# Patient Record
Sex: Male | Born: 1945
Health system: Southern US, Community
[De-identification: ages and names within clinical notes are randomized; demographics above are authoritative.]

## PROBLEM LIST (undated history)

## (undated) DIAGNOSIS — N200 Calculus of kidney: Secondary | ICD-10-CM

## (undated) DIAGNOSIS — I10 Essential (primary) hypertension: Secondary | ICD-10-CM

## (undated) DIAGNOSIS — E119 Type 2 diabetes mellitus without complications: Secondary | ICD-10-CM

---

## 2001-04-29 ENCOUNTER — Emergency Department (HOSPITAL_COMMUNITY): Admission: EM | Admit: 2001-04-29 | Discharge: 2001-04-29 | Payer: Self-pay | Admitting: Emergency Medicine

## 2001-07-28 ENCOUNTER — Ambulatory Visit (HOSPITAL_COMMUNITY): Admission: RE | Admit: 2001-07-28 | Discharge: 2001-07-28 | Payer: Self-pay | Admitting: Cardiology

## 2001-11-05 ENCOUNTER — Encounter: Payer: Self-pay | Admitting: Internal Medicine

## 2001-11-05 ENCOUNTER — Ambulatory Visit (HOSPITAL_COMMUNITY): Admission: RE | Admit: 2001-11-05 | Discharge: 2001-11-05 | Payer: Self-pay | Admitting: Internal Medicine

## 2002-03-26 ENCOUNTER — Encounter: Admission: RE | Admit: 2002-03-26 | Discharge: 2002-03-26 | Payer: Self-pay | Admitting: Urology

## 2002-03-26 ENCOUNTER — Encounter: Payer: Self-pay | Admitting: Urology

## 2002-12-24 ENCOUNTER — Encounter: Payer: Self-pay | Admitting: Urology

## 2002-12-24 ENCOUNTER — Ambulatory Visit (HOSPITAL_BASED_OUTPATIENT_CLINIC_OR_DEPARTMENT_OTHER): Admission: RE | Admit: 2002-12-24 | Discharge: 2002-12-24 | Payer: Self-pay | Admitting: Urology

## 2002-12-25 ENCOUNTER — Emergency Department (HOSPITAL_COMMUNITY): Admission: EM | Admit: 2002-12-25 | Discharge: 2002-12-26 | Payer: Self-pay | Admitting: Emergency Medicine

## 2003-01-28 ENCOUNTER — Ambulatory Visit (HOSPITAL_BASED_OUTPATIENT_CLINIC_OR_DEPARTMENT_OTHER): Admission: RE | Admit: 2003-01-28 | Discharge: 2003-01-28 | Payer: Self-pay | Admitting: Urology

## 2003-01-28 ENCOUNTER — Encounter: Payer: Self-pay | Admitting: Urology

## 2003-05-18 ENCOUNTER — Ambulatory Visit (HOSPITAL_COMMUNITY): Admission: RE | Admit: 2003-05-18 | Discharge: 2003-05-18 | Payer: Self-pay | Admitting: Urology

## 2003-05-18 ENCOUNTER — Ambulatory Visit (HOSPITAL_BASED_OUTPATIENT_CLINIC_OR_DEPARTMENT_OTHER): Admission: RE | Admit: 2003-05-18 | Discharge: 2003-05-18 | Payer: Self-pay | Admitting: Urology

## 2007-04-23 ENCOUNTER — Inpatient Hospital Stay (HOSPITAL_COMMUNITY): Admission: EM | Admit: 2007-04-23 | Discharge: 2007-04-25 | Payer: Self-pay | Admitting: Emergency Medicine

## 2007-04-23 IMAGING — US US ABDOMEN COMPLETE
1 series · 14 of 25 positions shown · non-contrast
Comparison: none

CLINICAL DATA: Chest pain.   Epigastric pain.
 ABDOMEN ULTRASOUND COMPLETE ? [DATE]:
TECHNIQUE: Complete abdominal ultrasound examination was performed including evaluation of the liver, gallbladder, bile ducts, pancreas, kidneys, spleen, IVC, and abdominal aorta.

[Series 1: unknown · 0.33mm/px · 14 of 59 slices shown]
[im 1/59]
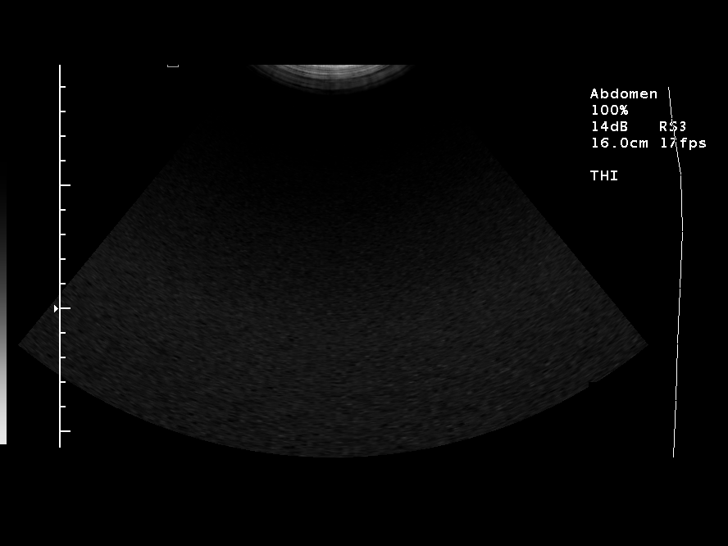
[im 5/59]
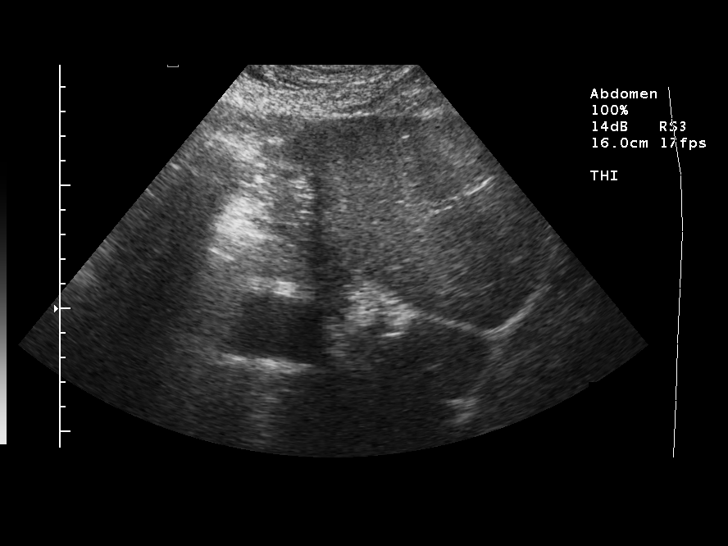
[im 10/59]
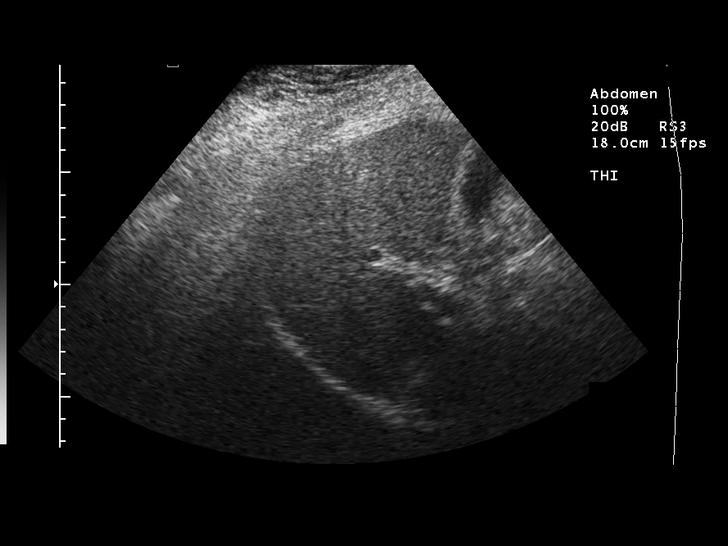
[im 15/59]
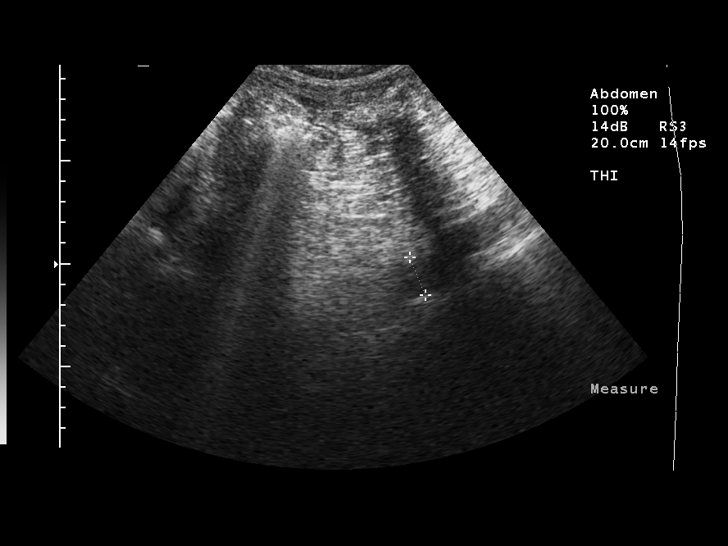
[im 20/59]
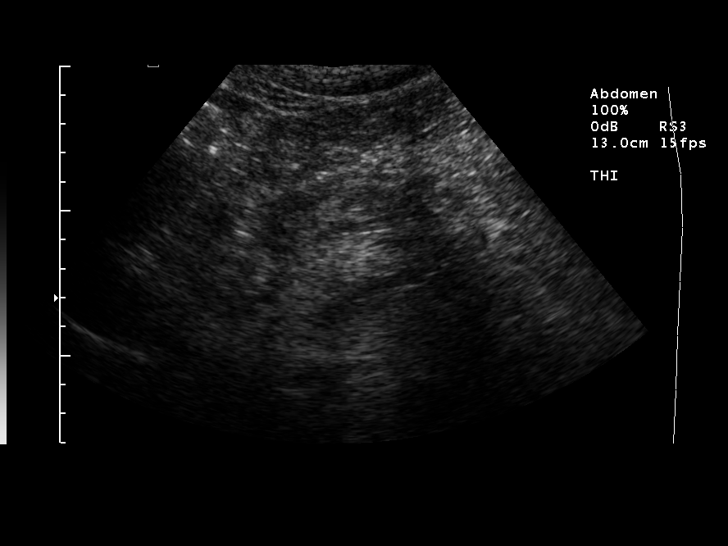
[im 22/59]
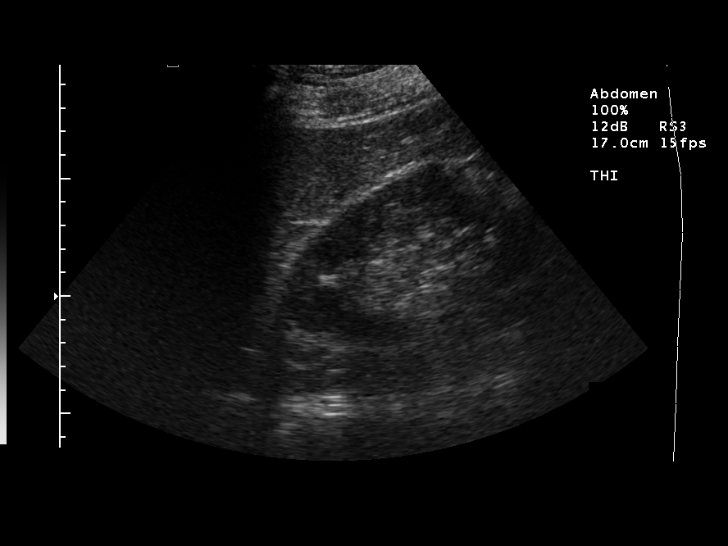
[im 27/59]
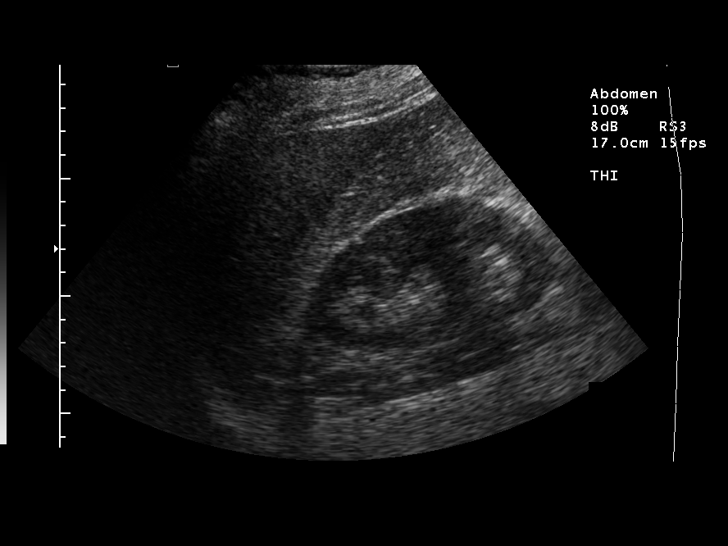
[im 32/59]
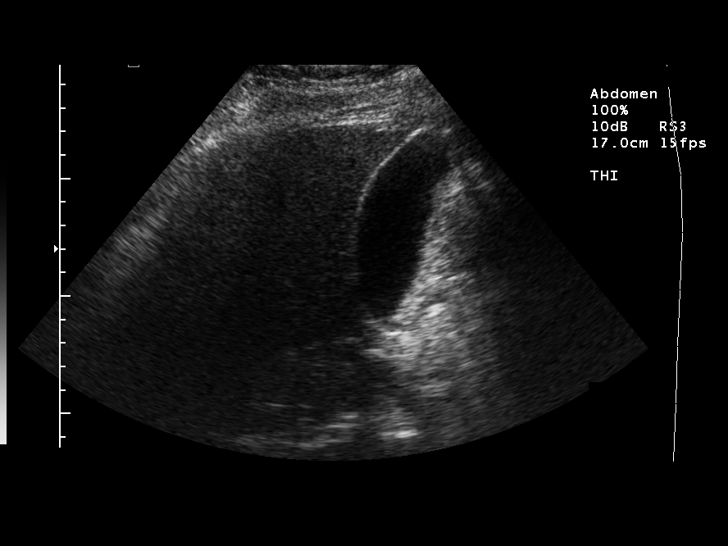
[im 37/59]
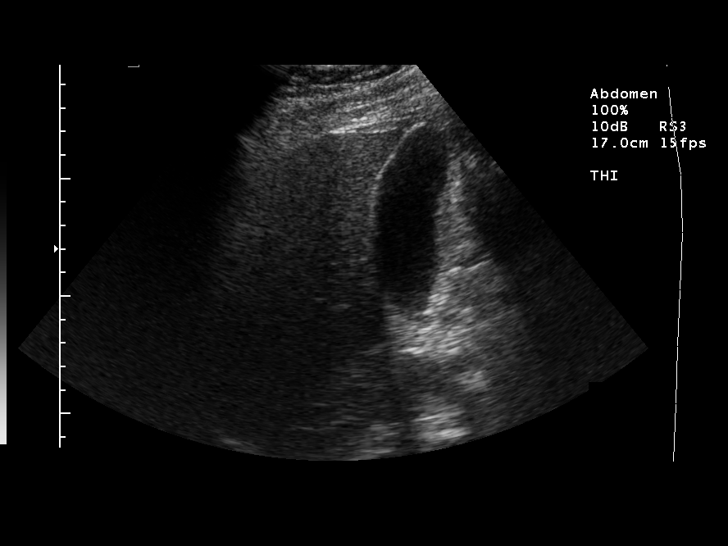
[im 39/59]
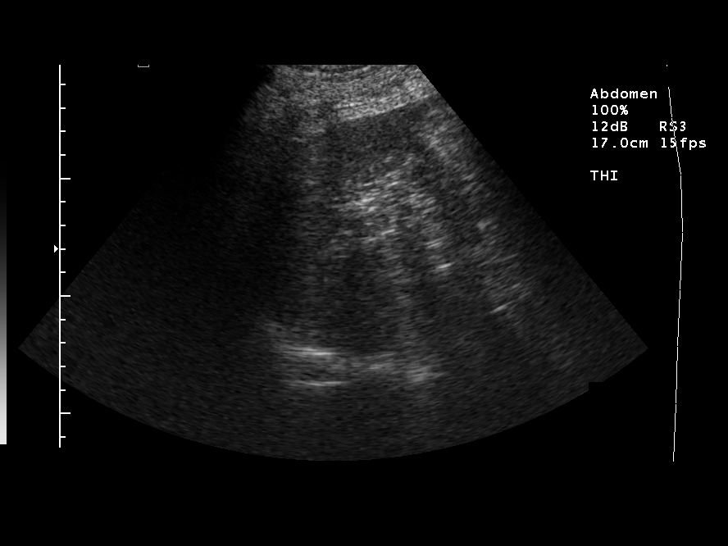
[im 44/59]
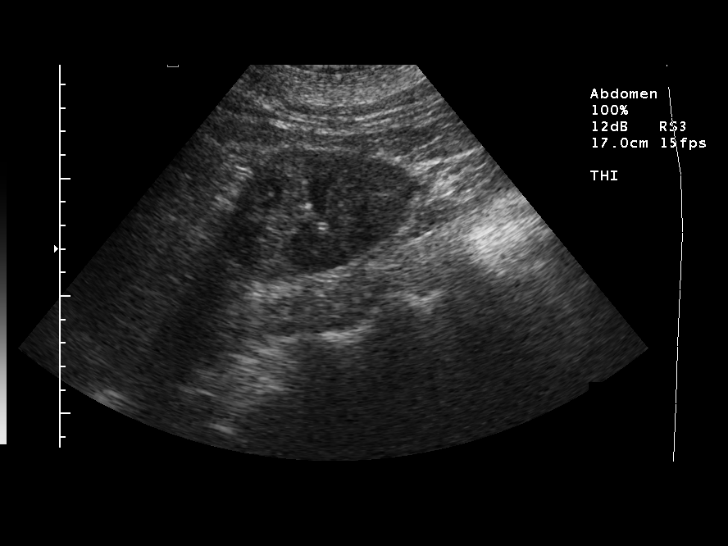
[im 49/59]
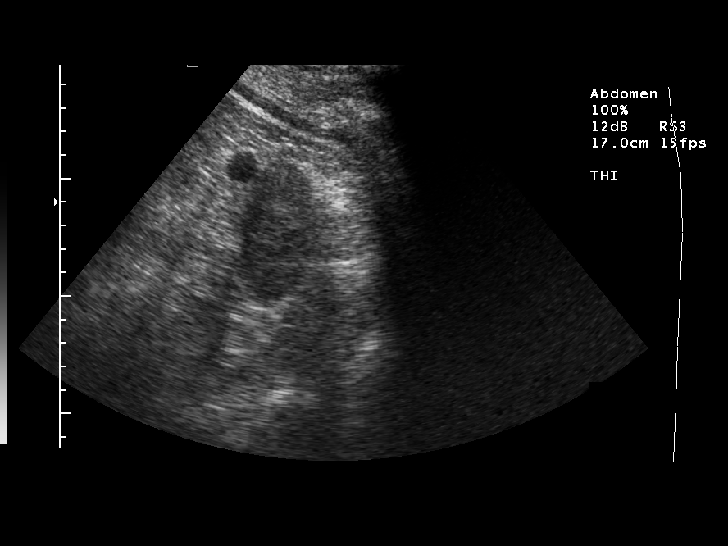
[im 54/59]
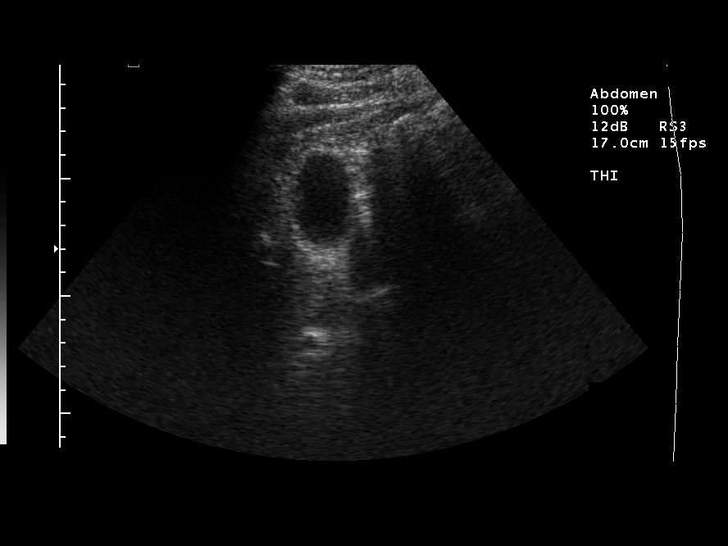
[im 59/59]
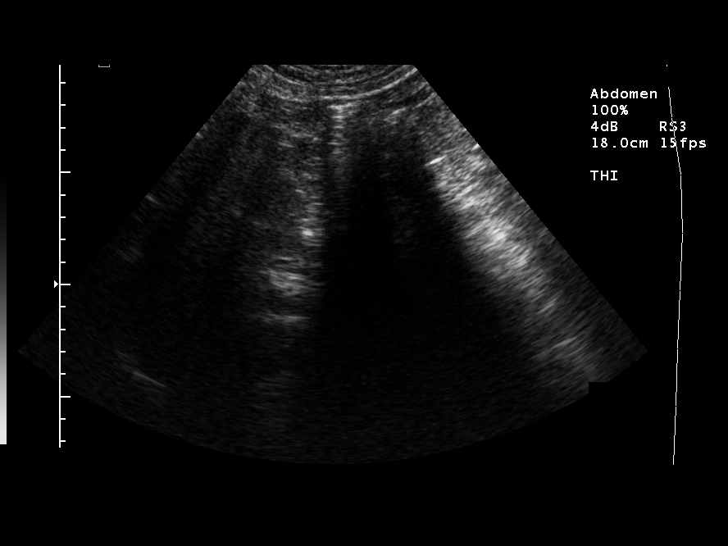

[14 of 25 positions shown; findings below may reference images not displayed]

FINDINGS: The liver, gallbladder, extrahepatic bile duct, and IVC are unremarkable.  The pancreas is not well visualized.  The spleen measures 10.1 cm.  There is a 1.3 x 1.2 x 1.0 cm anechoic lesion with increased through-transmission within the right kidney as well as a 1.5 x 1.5 x 1.3 cm anechoic lesion with increased through-transmission in the left kidney.  The aorta is unremarkable.
IMPRESSION: 1.  No acute findings.
 2.  Bilateral renal cysts.

## 2010-11-28 NOTE — H&P (Signed)
NAME:  Michael Tran, DOLPH NO.:  [id number]   MEDICAL RECORD NO.:  [id number]          PATIENT TYPE:  INP   LOCATION:  1825                         FACILITY:  [facility]   PHYSICIAN:  Mobolaji B. Bakare, M.D.DATE OF BIRTH:  May 07, 1946   DATE OF ADMISSION:  04/23/2007  DATE OF DISCHARGE:                              HISTORY & PHYSICAL   PRIMARY CARE PHYSICIAN:  Dr. Lucas Mallow.   CHIEF COMPLAINT:  Epigastric pain.   HISTORY OF PRESENTING COMPLAINT:  Michael Tran is a pleasant 65 year old  Caucasian male with history of diabetes mellitus type 2 and dyslipidemia  and hypertension. He was in his usual state of health until yesterday  afternoon while at work when he had a transient epigastric pain which  went away. It was less than 1 minute. This pain recurred about 10 p.m.  yesterday while watching TV, and he had to double over. He was  diaphoretic. Pain was radiating to his left arm. There was no  accompanying shortness of breath, palpitations, nausea or vomiting. The  patient lasted 30 minutes, and it went away spontaneously. The patient  went to bed late last night. The patient again woke him up about 2 p.m.  but did not last that long. Was able to go back to bed. Again, he had  the pain at 5:30 but with less intensity, and the patient went to Dr.  Pollie Friar office today.   At Dr. Pollie Friar office, EKG was done which clearly showed ST depression  in leads II, III and aVF which was a clear change from EKG in February  of 2008. He was then sent over to emergency room The patient received 4  baby aspirin in route from the office to the emergency room. He rated  the pain as 8/10 at his worst last night, and here in the emergency  room, pain is rated at 3/10. The patient received 1 sublingual  nitroglycerin, and pain has gone away completely. His blood pressure is  141/76 and pulse of 59. A repeat EKG done on arrival in the emergency  room showed sinus bradycardia with changes noted in  the office  completely resolved. He will be admitted for unstable angina, and Dr.  Lucas Mallow will seeing patient later today.   REVIEW OF SYSTEMS:  He denies fever. No headaches or dizziness. No  syncope. No cough, shortness of breath.   PAST MEDICAL HISTORY:  1. Type 2 diabetes mellitus.  2. Hypertension.  3. Dyslipidemia.  4. Anxiety.   PAST SURGICAL HISTORY:  1. Herniorrhaphy; the patient cannot recall which side.  2. Procedure for kidney stones.   CURRENT MEDICATIONS:  1. Atenolol 50 mg daily.  2. Lopid 600 mg b.i.d.  3. Accupril 20 mg daily.  4. Actos 30 mg daily.  5. Alprazolam 0.5 half to one tablet b.i.d.  6. Aspirin 81 mg daily.  7. Glipizide 5 mg daily.  8. Pravachol 40 mg daily; the patient has quit using Pravachol in the      past one week because he says it is giving him abdominal pain.   ALLERGIES:  He  does not know the type of allergy he has. He cannot  recall.   FAMILY HISTORY:  Mother passed away from stroke. Father died at the age  of 43 of heart attack. He does not have brothers or sisters. The patient  is married and lives with his wife.   SOCIAL HISTORY:  Does not smoke cigarettes. Does not drink alcohol. He  is Psychologist, occupational.   INITIAL VITAL SIGNS:  Temperature 97.0, blood pressure 141/76, pulse of  59, respiratory rate of 22, O2 saturations of 99%. On examination, the  patient is alert and oriented to time, place and person. Normocephalic,  atraumatic.  Pupils are equal, round, and reactive to light. Extraocular movements  intact.  No elevated JVD. No carotid bruits. Mucous membranes moist.  LUNGS:  Clear clinically to auscultation.  CARDIOVASCULAR:  S1/S2 regular. No murmur. No gallop. No rub.  ABDOMEN:  Nondistended. Soft. Nontender. Bowel sounds are present. There  is no holosystolic murmur. No palpable organomegaly.  EXTREMITIES:  No pedal edema or calf tenderness. Dorsalis pedis pulses  palpably bilaterally.  CENTRAL NERVOUS SYSTEM:  No focal  neurological deficit.   INITIAL LABORATORY DATA:  CK-MB 1.1, troponin 1, myoglobin 72.5,  creatinine 0.7. White cells 4.4, hemoglobin 13.7, hematocrit 40.3,  platelets 192, normal differential. Urinalysis and CMET are pending at  this time.   ASSESSMENT AND PLAN:  Michael Tran is a 65 year old Caucasian male with  multiple cardiovascular risk factors presenting with epigastric pain,  associated with EKG changes which has resolved on arrival in the  emergency room who is currently chest pain free after one sublingual  nitroglycerin. Initial cardiac markers at the point of care is normal.  The patient will be admitted with unstable angina and needs to exclude  other differential gallstone disease and abdominal aortic aneurysm.   ADMISSION DIAGNOSES:  1. Unstable angina/acute coronary syndrome. Admit to telemetry. Cycle      cardiac panel q.8h. x3 sets. Sublingual nitroglycerin p.r.n.      Lovenox 1 mg/kg body weight subcutaneously q.12h. Aspirin 325 mg      daily. Will continue atenolol at a reduced dose of 25 mg daily in      view of sinus bradycardia. Continue Accupril 30 mg daily. Will      obtain ultrasound of the abdomen to rule out gallstones and AAA.      Will give Protonix 40 mg p.o. daily. Dr. Lucas Mallow will be seeing      patient later today.  2. Diabetes mellitus. Will resume Glipizide, Actos and cover with      sliding scale insulin. Check hemoglobin A1c to ensure control prior      to hospitalization.  3. Dyslipidemia. Resume Lopid and Pravachol. Check fasting lipid      profile.      Mobolaji B. Corky Downs, M.D.  Electronically Signed    MBB/MEDQ  D:  04/23/2007  T:  04/23/2007  Job:  [id number]

## 2010-12-01 NOTE — Discharge Summary (Signed)
NAME:  Michael Tran, Michael Tran NO.:  [id number]   MEDICAL RECORD NO.:  [id number]          PATIENT TYPE:  INP   LOCATION:  2003                         FACILITY:  [facility]   PHYSICIAN:  Mobolaji B. Bakare, M.D.DATE OF BIRTH:  1945-12-01   DATE OF ADMISSION:  04/23/2007  DATE OF DISCHARGE:  04/25/2007                               DISCHARGE SUMMARY   PRIMARY CARE PHYSICIAN:  Jaclyn Prime. Lucas Mallow, M.D.   FINAL DIAGNOSES:  1. Atypical chest pain, noncardiac.  2. Epigastric pain.  3. Diabetes mellitus, controlled.  4. Dyslipidemia.   CONSULT:  Cardiology consult provided by Dr. Antionette Char.   PROCEDURES:  1. Stress test showed no evidence of inducible ischemia, normal LV      function with ejection fraction of 70%.  2. Ultrasound of the abdomen showed no acute findings bilateral renal      cyst.  3. Chest x-ray showed no acute cardiopulmonary process.   BRIEF HISTORY:  Please refer to the admission H&P for full details.  In  brief, Michael Tran was transferred to the hospital from Dr. Pollie Friar  office after an abnormal EKG in the office.  He apparently has been  having epigastric pain the day before admission this was transient and  intermittent.  It was not associated with nausea and vomiting,  palpitation or shortness of breath.  He went to see Dr. Lucas Mallow on the day  of admission and he noted ST depression in II, III and aVF.  The pain  was not in the chest but in the epigastrium.  On arrival in the  emergency room, the repeat EKG did not show ST depression as previously  noted.  The patient was admitted for further evaluation and treatment.   HOSPITAL COURSE:  PROBLEM #1 -  EPIGASTRIC PAIN:  It was felt prudent to  rule him out for inferior myocardial infarction given the EKG changes.  His cardiac enzymes were completely normal.  He underwent a stress test  which showed no inducible ischemia.  The patient did not have recurrence  of epigastric pain while in the hospital.   He was started on Protonix  intravenously and this was transitioned to p.o.  He had an ultrasound of  the abdomen to rule out gallstones.  Ultrasound was essentially normal.  It was felt that the patient would benefit from upper endoscopy to rule  out gastritis.  It should be mentioned that his amylase and lipase were  within normal.  Appointment was set up to follow up with Dr. Virginia Rochester on  May 12, 2007, at 2:00 p.m.   PROBLEM #2 -  DIABETES MELLITUS:  This was controlled during the course  of hospitalization, hemoglobin A1c was normal 6.6.  The patient was  continued on glipizide.   PROBLEM #3 -  DYSLIPIDEMIA:  Fasting blood lipids revealed total  cholesterol 224, HDL 45, LDL of 163.  The patient has been on Lopid.  He  will continue with this.  Further optimization per Dr. Lucas Mallow.   DISCHARGE MEDICATIONS:  1. Atenolol 50 mg daily.  2. Lopid 600 mg twice  a day.  3. Accupril 20 mg daily.  4. Actos 30 mg daily.  5. Glipizide 5 mg daily.  6. Alprazolam 0.5 mg half to one tablet b.i.d.  7. Aspirin 81 mg daily.  8. Flomax 0.4 mg daily.  9. Protonix 40 mg daily.   FOLLOW UP:  1. Follow up with Dr. Lucas Mallow in one to two weeks.  2. Follow up with Dr. Virginia Rochester on Monday May 12, 2007, at 2:00 p.m.   DISCHARGE LABORATORY DATA:  White cells 8.3, hemoglobin 13.1, hematocrit  38.7, platelets 198.  Hemoglobin A1c 6.6.      Mobolaji B. Corky Downs, M.D.  Electronically Signed     MBB/MEDQ  D:  05/09/2007  T:  05/11/2007  Job:  [id number]   cc:   Jaclyn Prime. Lucas Mallow, M.D.  Georgiana Spinner, M.D.

## 2010-12-01 NOTE — Op Note (Signed)
NAME:  Michael Tran, Michael Tran                          ACCOUNT NO.:  [id number]   MEDICAL RECORD NO.:  [id number]                   PATIENT TYPE:  AMB   LOCATION:  [facility]                                 FACILITY:  [facility]   PHYSICIAN:  Ronald L. Ovidio Hanger, M.D.           DATE OF BIRTH:  April 11, 1946   DATE OF PROCEDURE:  05/18/2003  DATE OF DISCHARGE:                                 OPERATIVE REPORT   DIAGNOSIS:  Multiple extensive mid ureterolithiasis.   OPERATIVE PROCEDURE:  1. Cystourethroscopy.  2. Right ureteroscopy with balloon dilatation of right lower ureter.  3. Holmium laser lithotripsy of multiple large stones.  4. Serial stone basket extraction of a large volume of stones.  5. Placement of right double-J stent.   SURGEON:  Lucrezia Starch. Earlene Plater, M.D.   ANESTHESIA:  LMA.   ESTIMATED BLOOD LOSS:  20 mL.   TUBES:  26 cm 7 French Cook double-pigtail stent.   COMPLICATIONS:  None.   INDICATIONS FOR PROCEDURE:  Michael Tran is a very nice, 65 year old, white  male, who originally presented with large stones.  He is status post ESWL on  January 28, 2003.  He has multiple large fragments that we have been following  and subsequently he has passed some fragments, but a very large fragment is  lodged in the upper ureter, and he has several fragments in the lower pole  kidney.  We felt a repeat ESWL was indicated and in June, he underwent  repeat ESWL.  He has again passed some fragments but has two large  fragments, one in the mid ureter and one above it that just passed out of  the kidney.  He has periodic pain and after understanding risks. benefits,  and alternatives elected to proceed with ureteroscopic stone extraction.   PROCEDURE IN DETAIL:  The patient was placed in a supine position.  After  proper LMA anesthesia, was placed in the dorsal lithotomy position and  prepped and draped with Betadine in a sterile fashion.  Cystourethroscopy  was performed with the 22.5 French Olympus  panendoscope, utilizing the 12  and 70 degree lenses.  The bladder was carefully inspected.  There was mild  trilobar hypertrophy; grade 1 trabeculation was noted, and efflux of clear  urine was noted from left ureteral orifice.  The right ureteral orifice was  in normal location.  Under fluoroscopic guidance, a 0.038 French Teflon-  coated guidewire was placed and noted to be within the right renal pelvis.  The stones could be seen on fluoroscopy.  The lower third ureter was then  balloon dilated with a 10 cm, 5 mm balloon dilator to 10 atmospheres of  pressure for two minutes.  It appeared to open widely without any waisting.  It was subsequently removed, and ureteroscopy was performed with a short,  thin ACMI ureteroscope.  Two large fragments were noted to be in the mid  ureter and above  it was a very large stone.  Utilizing the 365 laser fiber,  each of the stones were serially fractured at a repetition rate of 5 and a  setting of 0.5.  It was fragmented into multiple small fragments.  It was  extensive, required some time and multiple fragmentation.  Eventually, it  was fragmented into small enough fragments to begin extraction.  Serial  ureteroscopies then were performed to remove the fragments utilizing the  nitinol basket.  A total of approximately 14 ureteroscopies were performed  to remove the large volume stone debris.  Following this, stones were  submitted for stone analysis.  Reinspection of the ureter to the  ureteropelvic junction revealed only very small fragments and felt that they  would pass easily, and there were no perforations or significant bleeding  noted.  The ureteroscope was visually removed and under fluoroscopic  guidance utilizing the cystoscope, a 26 cm 7 French Cook double-pigtail  stent was placed in order to get in good position within the right renal  pelvis and within the bladder fluoroscopically.  The bladder was drained.  The panendoscope was removed.   A pull-out string was taped to the penis, and  the patient was taken to the recovery room stable.                                               Ronald L. Ovidio Hanger, M.D.    RLD/MEDQ  D:  05/18/2003  T:  05/18/2003  Job:  [id number]

## 2011-04-26 LAB — DIFFERENTIAL
Basophils Absolute: 0
Basophils Relative: 1
Eosinophils Absolute: 0
Eosinophils Relative: 1
Lymphocytes Relative: 22
Lymphs Abs: 1
Monocytes Absolute: 0.3
Monocytes Relative: 7
Neutro Abs: 3
Neutrophils Relative %: 69

## 2011-04-26 LAB — CK TOTAL AND CKMB (NOT AT ARMC)
CK, MB: 2
Relative Index: 1.5
Total CK: 136

## 2011-04-26 LAB — CBC
HCT: 40.3
Hemoglobin: 13.7
MCHC: 33.8
MCHC: 34
MCV: 92.8
Platelets: 191
Platelets: 198
RBC: 4.17 — ABNORMAL LOW
RBC: 4.34
RDW: 12.5
RDW: 12.6
WBC: 4.4

## 2011-04-26 LAB — URINE MICROSCOPIC-ADD ON

## 2011-04-26 LAB — COMPREHENSIVE METABOLIC PANEL
ALT: 24
AST: 25
Albumin: 4.3
Alkaline Phosphatase: 43
BUN: 17
CO2: 22
Calcium: 9.4
Chloride: 103
Creatinine, Ser: 0.82
GFR calc Af Amer: >60
GFR calc non Af Amer: >60
Glucose, Bld: 117 — ABNORMAL HIGH
Potassium: 4.1
Sodium: 135
Total Bilirubin: 0.6
Total Protein: 7.1

## 2011-04-26 LAB — URINALYSIS, ROUTINE W REFLEX MICROSCOPIC
Bilirubin Urine: NEGATIVE
Glucose, UA: NEGATIVE
Hgb urine dipstick: NEGATIVE
Ketones, ur: NEGATIVE
Nitrite: NEGATIVE
Protein, ur: NEGATIVE
Specific Gravity, Urine: 1.016
Urobilinogen, UA: 0.2
pH: 5.5

## 2011-04-26 LAB — POCT I-STAT CREATININE
Creatinine, Ser: 0.7
Operator id: 294521

## 2011-04-26 LAB — LIPID PANEL
Cholesterol: 224 — ABNORMAL HIGH
HDL: 45
LDL Cholesterol: 163 — ABNORMAL HIGH
Total CHOL/HDL Ratio: 5
Triglycerides: 80
VLDL: 16

## 2011-04-26 LAB — I-STAT 8, (EC8 V) (CONVERTED LAB)
BUN: 18
Bicarbonate: 22.5
Chloride: 105
Glucose, Bld: 111 — ABNORMAL HIGH
HCT: 43
Hemoglobin: 14.6
Operator id: 294521
Potassium: 4.1
Sodium: 134 — ABNORMAL LOW
TCO2: 23
pCO2, Ven: 29.6 — ABNORMAL LOW
pH, Ven: 7.49 — ABNORMAL HIGH

## 2011-04-26 LAB — HEMOGLOBIN A1C
Hgb A1c MFr Bld: 6.6 — ABNORMAL HIGH
Mean Plasma Glucose: 158

## 2011-04-26 LAB — PROTIME-INR
INR: 1
Prothrombin Time: 13.6

## 2011-04-26 LAB — POCT CARDIAC MARKERS
CKMB, poc: 1.1
Myoglobin, poc: 72.5
Operator id: 294521
Troponin i, poc: <0.05

## 2011-04-26 LAB — APTT: aPTT: 37

## 2011-04-26 LAB — TROPONIN I
Troponin I: 0.01
Troponin I: 0.02

## 2011-04-26 LAB — LIPASE, BLOOD: Lipase: 23

## 2011-05-02 ENCOUNTER — Encounter (INDEPENDENT_AMBULATORY_CARE_PROVIDER_SITE_OTHER): Payer: 59 | Admitting: Ophthalmology

## 2011-05-02 DIAGNOSIS — E11319 Type 2 diabetes mellitus with unspecified diabetic retinopathy without macular edema: Secondary | ICD-10-CM

## 2011-05-02 DIAGNOSIS — H2510 Age-related nuclear cataract, unspecified eye: Secondary | ICD-10-CM

## 2011-05-02 DIAGNOSIS — H43819 Vitreous degeneration, unspecified eye: Secondary | ICD-10-CM

## 2012-05-01 ENCOUNTER — Encounter (INDEPENDENT_AMBULATORY_CARE_PROVIDER_SITE_OTHER): Payer: Managed Care, Other (non HMO) | Admitting: Ophthalmology

## 2012-05-01 DIAGNOSIS — I10 Essential (primary) hypertension: Secondary | ICD-10-CM

## 2012-05-01 DIAGNOSIS — H35039 Hypertensive retinopathy, unspecified eye: Secondary | ICD-10-CM

## 2012-05-01 DIAGNOSIS — H43819 Vitreous degeneration, unspecified eye: Secondary | ICD-10-CM

## 2012-05-01 DIAGNOSIS — E11319 Type 2 diabetes mellitus with unspecified diabetic retinopathy without macular edema: Secondary | ICD-10-CM

## 2012-05-01 DIAGNOSIS — E1165 Type 2 diabetes mellitus with hyperglycemia: Secondary | ICD-10-CM

## 2012-05-01 DIAGNOSIS — H2510 Age-related nuclear cataract, unspecified eye: Secondary | ICD-10-CM

## 2013-05-04 ENCOUNTER — Ambulatory Visit (INDEPENDENT_AMBULATORY_CARE_PROVIDER_SITE_OTHER): Payer: Managed Care, Other (non HMO) | Admitting: Ophthalmology

## 2013-05-06 ENCOUNTER — Ambulatory Visit (INDEPENDENT_AMBULATORY_CARE_PROVIDER_SITE_OTHER): Payer: Self-pay | Admitting: Ophthalmology

## 2014-07-21 DIAGNOSIS — E118 Type 2 diabetes mellitus with unspecified complications: Secondary | ICD-10-CM | POA: Diagnosis not present

## 2014-07-21 DIAGNOSIS — I10 Essential (primary) hypertension: Secondary | ICD-10-CM | POA: Diagnosis not present

## 2014-07-21 DIAGNOSIS — M109 Gout, unspecified: Secondary | ICD-10-CM | POA: Diagnosis not present

## 2014-11-01 DIAGNOSIS — E1121 Type 2 diabetes mellitus with diabetic nephropathy: Secondary | ICD-10-CM | POA: Diagnosis not present

## 2014-11-01 DIAGNOSIS — M109 Gout, unspecified: Secondary | ICD-10-CM | POA: Diagnosis not present

## 2014-11-01 DIAGNOSIS — E559 Vitamin D deficiency, unspecified: Secondary | ICD-10-CM | POA: Diagnosis not present

## 2014-11-01 DIAGNOSIS — I10 Essential (primary) hypertension: Secondary | ICD-10-CM | POA: Diagnosis not present

## 2014-11-08 DIAGNOSIS — M109 Gout, unspecified: Secondary | ICD-10-CM | POA: Diagnosis not present

## 2014-11-08 DIAGNOSIS — E785 Hyperlipidemia, unspecified: Secondary | ICD-10-CM | POA: Diagnosis not present

## 2014-11-08 DIAGNOSIS — I10 Essential (primary) hypertension: Secondary | ICD-10-CM | POA: Diagnosis not present

## 2014-11-08 DIAGNOSIS — N182 Chronic kidney disease, stage 2 (mild): Secondary | ICD-10-CM | POA: Diagnosis not present

## 2014-11-08 DIAGNOSIS — E559 Vitamin D deficiency, unspecified: Secondary | ICD-10-CM | POA: Diagnosis not present

## 2014-11-08 DIAGNOSIS — E1121 Type 2 diabetes mellitus with diabetic nephropathy: Secondary | ICD-10-CM | POA: Diagnosis not present

## 2015-02-14 DIAGNOSIS — E1122 Type 2 diabetes mellitus with diabetic chronic kidney disease: Secondary | ICD-10-CM | POA: Diagnosis not present

## 2015-02-14 DIAGNOSIS — E1121 Type 2 diabetes mellitus with diabetic nephropathy: Secondary | ICD-10-CM | POA: Diagnosis not present

## 2015-02-14 DIAGNOSIS — E785 Hyperlipidemia, unspecified: Secondary | ICD-10-CM | POA: Diagnosis not present

## 2015-02-14 DIAGNOSIS — I10 Essential (primary) hypertension: Secondary | ICD-10-CM | POA: Diagnosis not present

## 2015-02-14 DIAGNOSIS — Z13820 Encounter for screening for osteoporosis: Secondary | ICD-10-CM | POA: Diagnosis not present

## 2015-02-14 DIAGNOSIS — Z1389 Encounter for screening for other disorder: Secondary | ICD-10-CM | POA: Diagnosis not present

## 2015-02-14 DIAGNOSIS — E559 Vitamin D deficiency, unspecified: Secondary | ICD-10-CM | POA: Diagnosis not present

## 2015-02-14 DIAGNOSIS — Z125 Encounter for screening for malignant neoplasm of prostate: Secondary | ICD-10-CM | POA: Diagnosis not present

## 2015-02-14 DIAGNOSIS — Z23 Encounter for immunization: Secondary | ICD-10-CM | POA: Diagnosis not present

## 2015-02-14 DIAGNOSIS — M109 Gout, unspecified: Secondary | ICD-10-CM | POA: Diagnosis not present

## 2015-02-14 DIAGNOSIS — E663 Overweight: Secondary | ICD-10-CM | POA: Diagnosis not present

## 2015-02-14 DIAGNOSIS — Z0000 Encounter for general adult medical examination without abnormal findings: Secondary | ICD-10-CM | POA: Diagnosis not present

## 2015-02-21 DIAGNOSIS — I129 Hypertensive chronic kidney disease with stage 1 through stage 4 chronic kidney disease, or unspecified chronic kidney disease: Secondary | ICD-10-CM | POA: Diagnosis not present

## 2015-02-21 DIAGNOSIS — N182 Chronic kidney disease, stage 2 (mild): Secondary | ICD-10-CM | POA: Diagnosis not present

## 2015-02-21 DIAGNOSIS — M109 Gout, unspecified: Secondary | ICD-10-CM | POA: Diagnosis not present

## 2015-02-21 DIAGNOSIS — E1122 Type 2 diabetes mellitus with diabetic chronic kidney disease: Secondary | ICD-10-CM | POA: Diagnosis not present

## 2015-02-21 DIAGNOSIS — M8580 Other specified disorders of bone density and structure, unspecified site: Secondary | ICD-10-CM | POA: Diagnosis not present

## 2015-02-21 DIAGNOSIS — E785 Hyperlipidemia, unspecified: Secondary | ICD-10-CM | POA: Diagnosis not present

## 2015-04-29 DIAGNOSIS — Z23 Encounter for immunization: Secondary | ICD-10-CM | POA: Diagnosis not present

## 2015-05-19 DIAGNOSIS — H2513 Age-related nuclear cataract, bilateral: Secondary | ICD-10-CM | POA: Diagnosis not present

## 2015-05-19 DIAGNOSIS — E119 Type 2 diabetes mellitus without complications: Secondary | ICD-10-CM | POA: Diagnosis not present

## 2015-08-17 DIAGNOSIS — I129 Hypertensive chronic kidney disease with stage 1 through stage 4 chronic kidney disease, or unspecified chronic kidney disease: Secondary | ICD-10-CM | POA: Diagnosis not present

## 2015-08-17 DIAGNOSIS — M109 Gout, unspecified: Secondary | ICD-10-CM | POA: Diagnosis not present

## 2015-08-17 DIAGNOSIS — M8580 Other specified disorders of bone density and structure, unspecified site: Secondary | ICD-10-CM | POA: Diagnosis not present

## 2015-08-17 DIAGNOSIS — E1122 Type 2 diabetes mellitus with diabetic chronic kidney disease: Secondary | ICD-10-CM | POA: Diagnosis not present

## 2015-08-24 DIAGNOSIS — E1122 Type 2 diabetes mellitus with diabetic chronic kidney disease: Secondary | ICD-10-CM | POA: Diagnosis not present

## 2015-08-24 DIAGNOSIS — I129 Hypertensive chronic kidney disease with stage 1 through stage 4 chronic kidney disease, or unspecified chronic kidney disease: Secondary | ICD-10-CM | POA: Diagnosis not present

## 2015-08-24 DIAGNOSIS — E785 Hyperlipidemia, unspecified: Secondary | ICD-10-CM | POA: Diagnosis not present

## 2015-08-24 DIAGNOSIS — N182 Chronic kidney disease, stage 2 (mild): Secondary | ICD-10-CM | POA: Diagnosis not present

## 2015-11-21 DIAGNOSIS — E1122 Type 2 diabetes mellitus with diabetic chronic kidney disease: Secondary | ICD-10-CM | POA: Diagnosis not present

## 2015-11-21 DIAGNOSIS — E559 Vitamin D deficiency, unspecified: Secondary | ICD-10-CM | POA: Diagnosis not present

## 2015-11-21 DIAGNOSIS — I129 Hypertensive chronic kidney disease with stage 1 through stage 4 chronic kidney disease, or unspecified chronic kidney disease: Secondary | ICD-10-CM | POA: Diagnosis not present

## 2015-11-21 DIAGNOSIS — M109 Gout, unspecified: Secondary | ICD-10-CM | POA: Diagnosis not present

## 2015-11-21 DIAGNOSIS — M8580 Other specified disorders of bone density and structure, unspecified site: Secondary | ICD-10-CM | POA: Diagnosis not present

## 2015-11-28 DIAGNOSIS — I129 Hypertensive chronic kidney disease with stage 1 through stage 4 chronic kidney disease, or unspecified chronic kidney disease: Secondary | ICD-10-CM | POA: Diagnosis not present

## 2015-11-28 DIAGNOSIS — E1122 Type 2 diabetes mellitus with diabetic chronic kidney disease: Secondary | ICD-10-CM | POA: Diagnosis not present

## 2015-11-28 DIAGNOSIS — E785 Hyperlipidemia, unspecified: Secondary | ICD-10-CM | POA: Diagnosis not present

## 2015-11-28 DIAGNOSIS — N182 Chronic kidney disease, stage 2 (mild): Secondary | ICD-10-CM | POA: Diagnosis not present

## 2016-05-11 DIAGNOSIS — Z23 Encounter for immunization: Secondary | ICD-10-CM | POA: Diagnosis not present

## 2016-05-21 DIAGNOSIS — E119 Type 2 diabetes mellitus without complications: Secondary | ICD-10-CM | POA: Diagnosis not present

## 2016-05-21 DIAGNOSIS — H2513 Age-related nuclear cataract, bilateral: Secondary | ICD-10-CM | POA: Diagnosis not present

## 2016-05-23 DIAGNOSIS — E785 Hyperlipidemia, unspecified: Secondary | ICD-10-CM | POA: Diagnosis not present

## 2016-05-23 DIAGNOSIS — M8580 Other specified disorders of bone density and structure, unspecified site: Secondary | ICD-10-CM | POA: Diagnosis not present

## 2016-05-23 DIAGNOSIS — Z125 Encounter for screening for malignant neoplasm of prostate: Secondary | ICD-10-CM | POA: Diagnosis not present

## 2016-05-23 DIAGNOSIS — I129 Hypertensive chronic kidney disease with stage 1 through stage 4 chronic kidney disease, or unspecified chronic kidney disease: Secondary | ICD-10-CM | POA: Diagnosis not present

## 2016-05-23 DIAGNOSIS — E1122 Type 2 diabetes mellitus with diabetic chronic kidney disease: Secondary | ICD-10-CM | POA: Diagnosis not present

## 2016-05-23 DIAGNOSIS — M109 Gout, unspecified: Secondary | ICD-10-CM | POA: Diagnosis not present

## 2016-05-23 DIAGNOSIS — Z0000 Encounter for general adult medical examination without abnormal findings: Secondary | ICD-10-CM | POA: Diagnosis not present

## 2016-05-30 DIAGNOSIS — I129 Hypertensive chronic kidney disease with stage 1 through stage 4 chronic kidney disease, or unspecified chronic kidney disease: Secondary | ICD-10-CM | POA: Diagnosis not present

## 2016-05-30 DIAGNOSIS — E1122 Type 2 diabetes mellitus with diabetic chronic kidney disease: Secondary | ICD-10-CM | POA: Diagnosis not present

## 2016-05-30 DIAGNOSIS — N182 Chronic kidney disease, stage 2 (mild): Secondary | ICD-10-CM | POA: Diagnosis not present

## 2016-05-30 DIAGNOSIS — Z1212 Encounter for screening for malignant neoplasm of rectum: Secondary | ICD-10-CM | POA: Diagnosis not present

## 2016-05-30 DIAGNOSIS — E785 Hyperlipidemia, unspecified: Secondary | ICD-10-CM | POA: Diagnosis not present

## 2016-10-01 DIAGNOSIS — E1122 Type 2 diabetes mellitus with diabetic chronic kidney disease: Secondary | ICD-10-CM | POA: Diagnosis not present

## 2016-10-01 DIAGNOSIS — E785 Hyperlipidemia, unspecified: Secondary | ICD-10-CM | POA: Diagnosis not present

## 2017-04-01 DIAGNOSIS — E1122 Type 2 diabetes mellitus with diabetic chronic kidney disease: Secondary | ICD-10-CM | POA: Diagnosis not present

## 2017-04-04 DIAGNOSIS — E1122 Type 2 diabetes mellitus with diabetic chronic kidney disease: Secondary | ICD-10-CM | POA: Diagnosis not present

## 2017-04-04 DIAGNOSIS — E785 Hyperlipidemia, unspecified: Secondary | ICD-10-CM | POA: Diagnosis not present

## 2017-05-27 DIAGNOSIS — E119 Type 2 diabetes mellitus without complications: Secondary | ICD-10-CM | POA: Diagnosis not present

## 2017-05-27 DIAGNOSIS — H2513 Age-related nuclear cataract, bilateral: Secondary | ICD-10-CM | POA: Diagnosis not present

## 2017-05-29 DIAGNOSIS — E1122 Type 2 diabetes mellitus with diabetic chronic kidney disease: Secondary | ICD-10-CM | POA: Diagnosis not present

## 2017-05-29 DIAGNOSIS — I129 Hypertensive chronic kidney disease with stage 1 through stage 4 chronic kidney disease, or unspecified chronic kidney disease: Secondary | ICD-10-CM | POA: Diagnosis not present

## 2017-05-29 DIAGNOSIS — E785 Hyperlipidemia, unspecified: Secondary | ICD-10-CM | POA: Diagnosis not present

## 2017-05-29 DIAGNOSIS — Z125 Encounter for screening for malignant neoplasm of prostate: Secondary | ICD-10-CM | POA: Diagnosis not present

## 2017-05-29 DIAGNOSIS — I10 Essential (primary) hypertension: Secondary | ICD-10-CM | POA: Diagnosis not present

## 2017-05-29 DIAGNOSIS — Z0000 Encounter for general adult medical examination without abnormal findings: Secondary | ICD-10-CM | POA: Diagnosis not present

## 2017-06-11 DIAGNOSIS — E559 Vitamin D deficiency, unspecified: Secondary | ICD-10-CM | POA: Diagnosis not present

## 2017-06-11 DIAGNOSIS — E7800 Pure hypercholesterolemia, unspecified: Secondary | ICD-10-CM | POA: Diagnosis not present

## 2017-06-11 DIAGNOSIS — L97521 Non-pressure chronic ulcer of other part of left foot limited to breakdown of skin: Secondary | ICD-10-CM | POA: Diagnosis not present

## 2017-06-11 DIAGNOSIS — E11621 Type 2 diabetes mellitus with foot ulcer: Secondary | ICD-10-CM | POA: Diagnosis not present

## 2017-06-11 DIAGNOSIS — I10 Essential (primary) hypertension: Secondary | ICD-10-CM | POA: Diagnosis not present

## 2017-06-11 DIAGNOSIS — E119 Type 2 diabetes mellitus without complications: Secondary | ICD-10-CM | POA: Diagnosis not present

## 2017-06-11 DIAGNOSIS — Z8249 Family history of ischemic heart disease and other diseases of the circulatory system: Secondary | ICD-10-CM | POA: Diagnosis not present

## 2017-06-20 ENCOUNTER — Ambulatory Visit: Payer: Self-pay | Admitting: Podiatry

## 2017-06-27 DIAGNOSIS — E1122 Type 2 diabetes mellitus with diabetic chronic kidney disease: Secondary | ICD-10-CM | POA: Diagnosis not present

## 2017-07-04 DIAGNOSIS — I10 Essential (primary) hypertension: Secondary | ICD-10-CM | POA: Diagnosis not present

## 2017-07-04 DIAGNOSIS — E119 Type 2 diabetes mellitus without complications: Secondary | ICD-10-CM | POA: Diagnosis not present

## 2017-07-22 ENCOUNTER — Encounter: Payer: Self-pay | Admitting: Podiatry

## 2017-07-22 ENCOUNTER — Other Ambulatory Visit: Payer: Self-pay | Admitting: Podiatry

## 2017-07-22 ENCOUNTER — Ambulatory Visit: Payer: Medicare HMO | Admitting: Podiatry

## 2017-07-22 ENCOUNTER — Ambulatory Visit (INDEPENDENT_AMBULATORY_CARE_PROVIDER_SITE_OTHER): Payer: Medicare HMO

## 2017-07-22 DIAGNOSIS — L84 Corns and callosities: Secondary | ICD-10-CM | POA: Diagnosis not present

## 2017-07-22 DIAGNOSIS — E1140 Type 2 diabetes mellitus with diabetic neuropathy, unspecified: Secondary | ICD-10-CM

## 2017-07-22 DIAGNOSIS — M2042 Other hammer toe(s) (acquired), left foot: Secondary | ICD-10-CM

## 2017-07-22 DIAGNOSIS — M79672 Pain in left foot: Secondary | ICD-10-CM

## 2017-07-22 DIAGNOSIS — E1149 Type 2 diabetes mellitus with other diabetic neurological complication: Secondary | ICD-10-CM | POA: Diagnosis not present

## 2017-07-22 NOTE — Progress Notes (Signed)
Subjective:   Patient ID: Michael Tran, male   DOB: 72 y.o.   MRN: [id number]   HPI Patient presents with painful lesion distal aspect digit 3 left with keratotic tissue formation and slight discoloration with patient who is a diabetic under good control.  Patient does not smoke and likes to be active   Review of Systems  All other systems reviewed and are negative.       Objective:  Physical Exam  Constitutional: He appears well-developed and well-nourished.  Cardiovascular: Intact distal pulses.  Pulmonary/Chest: Effort normal.  Musculoskeletal: Normal range of motion.  Neurological: He is alert.  Skin: Skin is warm.  Nursing note and vitals reviewed.   Neurovascular status intact muscle strength was adequate range of motion within normal limits with distal lateral keratotic lesion digit 3 left is painful when pressed with slight breakdown of tissue localized in nature with obvious structural changes to the toe itself.  Good digital perfusion well oriented x3     Assessment:  Significant hammertoe deformity digit 3 left with distal lateral keratotic lesion formation     Plan:  H&P condition reviewed and debridement accomplished of distal lateral lesion and dispensed buttress pad and dispensed a cushion pad for the toe and discussed possible digital hammertoe procedure in future educating patient on the procedure  X-rays indicate that the toe is malaligned and slightly dislocated with compression occurring

## 2017-07-22 NOTE — Progress Notes (Signed)
   Subjective:    Patient ID: Michael Tran, male    DOB: [birth year], [age] y.o.   MRN: [id number]  HPI    Review of Systems  All other systems reviewed and are negative.      Objective:   Physical Exam        Assessment & Plan:

## 2017-07-30 ENCOUNTER — Encounter (HOSPITAL_COMMUNITY): Payer: Self-pay | Admitting: Emergency Medicine

## 2017-07-30 ENCOUNTER — Other Ambulatory Visit: Payer: Self-pay

## 2017-07-30 ENCOUNTER — Emergency Department (HOSPITAL_COMMUNITY)
Admission: EM | Admit: 2017-07-30 | Discharge: 2017-07-30 | Disposition: A | Discharge status: Home / Self Care | Payer: Medicare HMO | Attending: Emergency Medicine | Admitting: Emergency Medicine

## 2017-07-30 ENCOUNTER — Telehealth: Payer: Self-pay | Admitting: Emergency Medicine

## 2017-07-30 DIAGNOSIS — I10 Essential (primary) hypertension: Secondary | ICD-10-CM | POA: Insufficient documentation

## 2017-07-30 DIAGNOSIS — E119 Type 2 diabetes mellitus without complications: Secondary | ICD-10-CM | POA: Insufficient documentation

## 2017-07-30 DIAGNOSIS — Z7984 Long term (current) use of oral hypoglycemic drugs: Secondary | ICD-10-CM | POA: Insufficient documentation

## 2017-07-30 DIAGNOSIS — N400 Enlarged prostate without lower urinary tract symptoms: Secondary | ICD-10-CM | POA: Diagnosis not present

## 2017-07-30 DIAGNOSIS — Z7982 Long term (current) use of aspirin: Secondary | ICD-10-CM | POA: Diagnosis not present

## 2017-07-30 DIAGNOSIS — N401 Enlarged prostate with lower urinary tract symptoms: Secondary | ICD-10-CM | POA: Diagnosis not present

## 2017-07-30 DIAGNOSIS — R339 Retention of urine, unspecified: Secondary | ICD-10-CM

## 2017-07-30 DIAGNOSIS — R300 Dysuria: Secondary | ICD-10-CM | POA: Diagnosis not present

## 2017-07-30 DIAGNOSIS — Z79899 Other long term (current) drug therapy: Secondary | ICD-10-CM | POA: Insufficient documentation

## 2017-07-30 DIAGNOSIS — R319 Hematuria, unspecified: Secondary | ICD-10-CM | POA: Diagnosis not present

## 2017-07-30 HISTORY — DX: Type 2 diabetes mellitus without complications: E11.9

## 2017-07-30 HISTORY — DX: Calculus of kidney: N20.0

## 2017-07-30 HISTORY — DX: Essential (primary) hypertension: I10

## 2017-07-30 LAB — URINALYSIS, ROUTINE W REFLEX MICROSCOPIC
BILIRUBIN URINE: NEGATIVE
GLUCOSE, UA: 50 mg/dL — AB
KETONES UR: 20 mg/dL — AB
LEUKOCYTES UA: NEGATIVE
Nitrite: NEGATIVE
PROTEIN: NEGATIVE mg/dL
Specific Gravity, Urine: 1.015 (ref 1.005–1.030)
Squamous Epithelial / LPF: NONE SEEN
pH: 5 (ref 5.0–8.0)

## 2017-07-30 LAB — BASIC METABOLIC PANEL
ANION GAP: 14 (ref 5–15)
BUN: 15 mg/dL (ref 6–20)
CALCIUM: 10.5 mg/dL — AB (ref 8.9–10.3)
CO2: 19 mmol/L — ABNORMAL LOW (ref 22–32)
CREATININE: 0.87 mg/dL (ref 0.61–1.24)
Chloride: 97 mmol/L — ABNORMAL LOW (ref 101–111)
GFR calc Af Amer: >60 mL/min (ref >60)
GFR calc non Af Amer: >60 mL/min (ref >60)
GLUCOSE: 153 mg/dL — AB (ref 65–99)
Potassium: 4.8 mmol/L (ref 3.5–5.1)
Sodium: 130 mmol/L — ABNORMAL LOW (ref 135–145)

## 2017-07-30 LAB — CBC
HCT: 40.6 % (ref 39.0–52.0)
Hemoglobin: 13.7 g/dL (ref 13.0–17.0)
MCH: 30.4 pg (ref 26.0–34.0)
MCHC: 33.7 g/dL (ref 30.0–36.0)
MCV: 90 fL (ref 78.0–100.0)
PLATELETS: 207 10*3/uL (ref 150–400)
RBC: 4.51 MIL/uL (ref 4.22–5.81)
RDW: 12.4 % (ref 11.5–15.5)
WBC: 8.4 10*3/uL (ref 4.0–10.5)

## 2017-07-30 MED ORDER — CIPROFLOXACIN HCL 500 MG PO TABS: 500.0000 mg | ORAL_TABLET | Freq: Two times a day (BID) | ORAL | 0 refills | Status: AC

## 2017-07-30 NOTE — ED Triage Notes (Signed)
Pt reports he urinated a very minimal amount this morning but has not been able to go since then, unable to report what time it was, hx of kidney stones in early 2000's.

## 2017-07-30 NOTE — Discharge Instructions (Signed)
Drink plenty of fluids.  Use Tylenol if needed for pain or fever.  Call the urologist for follow-up appointment next week and return here, if needed.

## 2017-07-30 NOTE — Telephone Encounter (Signed)
[facility] 500 BID which read for 7 days but only had 10 pills listed.  Consulted Dr. Criss AlvineGoldston who reviewed chart and advised a 5 day course of ABX would be sufficient.  Spoke with [facility] to confirm 5 day course of therapy.  No further CM needs noted at this time.

## 2017-07-30 NOTE — ED Provider Notes (Signed)
[facility] EMERGENCY DEPARTMENT Provider Note   CSN: [id number] Arrival date & time: 07/30/17  1218     History   Chief Complaint Chief Complaint  Patient presents with  . Urinary Retention    HPI Michael Tran is a 72 y.o. male.  He presents for evaluation of inability to void, beginning today.  He tried multiple times but was unable to urinate, in any position.  He denies preceding dysuria, hematuria, nausea, vomiting, fever, chills, weakness or dizziness.  No prior similar problem.  He takes Flomax, chronically for prostatic hypertrophy.  No recent urologic evaluations.  There are no other known modifying factors.    HPI  Past Medical History:  Diagnosis Date  . Diabetes mellitus without complication (HCC)   . Hypertension   . Kidney stones     There are no active problems to display for this patient.   History reviewed. No pertinent surgical history.     Home Medications    Prior to Admission medications   Medication Sig Start Date End Date Taking? Authorizing Provider  acetaminophen (TYLENOL) 325 MG tablet Take 325 mg by mouth every 6 (six) hours as needed (for headaches).   Yes [provider]  aspirin EC 81 MG tablet Take 81 mg by mouth at bedtime.   Yes [provider]  atenolol (TENORMIN) 25 MG tablet Take 25 mg by mouth at bedtime.    Yes [provider]  Cholecalciferol (VITAMIN D-3) 1000 units CAPS Take 1,000 Units by mouth 2 (two) times daily.   Yes [provider]  glipiZIDE (GLUCOTROL) 5 MG tablet Take 5 mg by mouth 2 (two) times daily before a meal.   Yes [provider]  lisinopril (PRINIVIL,ZESTRIL) 20 MG tablet Take 20 mg by mouth daily.   Yes [provider]  Magnesium 250 MG TABS Take 250 mg by mouth daily with breakfast.   Yes [provider]  metFORMIN (GLUCOPHAGE) 1000 MG tablet Take 1,000 mg by mouth 2 (two) times daily with a meal.   Yes [provider]  pioglitazone (ACTOS) 15 MG tablet Take 15 mg by mouth daily.   Yes [provider]  simvastatin (ZOCOR) 40 MG tablet Take 40 mg by mouth at bedtime.   Yes [provider]  tamsulosin (FLOMAX) 0.4 MG CAPS capsule Take 0.4 mg by mouth at bedtime.   Yes [provider]  ciprofloxacin (CIPRO) 500 MG tablet Take 1 tablet (500 mg total) by mouth 2 (two) times daily. One po bid x 7 days 07/30/17   Mancel BaleWentz, Jimmey Hengel, MD    Family History No family history on file.  Social History Social History   Tobacco Use  . Smoking status: Never Smoker  Substance Use Topics  . Alcohol use: Not on file  . Drug use: No     Allergies   Erythromycin   Review of Systems Review of Systems  All other systems reviewed and are negative.    Physical Exam Updated Vital Signs BP 117/67   Pulse (!) 55   Temp (!) 97.5 F (36.4 C) (Oral)   Resp 18   SpO2 99%   Physical Exam  Constitutional: He is oriented to person, place, and time. He appears well-developed and well-nourished. No distress.  HENT:  Head: Normocephalic and atraumatic.  Right Ear: External ear normal.  Left Ear: External ear normal.  Eyes: Conjunctivae and EOM are normal. Pupils are equal, round, and reactive to light.  Neck: Normal  range of motion and phonation normal. Neck supple.  Cardiovascular: Normal rate, regular rhythm and normal heart sounds.  Pulmonary/Chest: Effort normal and breath sounds normal. He exhibits no bony tenderness.  Abdominal: Soft. There is no tenderness.  Genitourinary:  Genitourinary Comments: Prostate slightly firm left greater than right with enlargement by about 50%.  No significant prostate tenderness.  Musculoskeletal: Normal range of motion.  Neurological: He is alert and oriented to person, place, and time. No cranial nerve deficit or sensory deficit. He exhibits normal muscle tone. Coordination normal.  Skin: Skin is warm, dry and intact.  Psychiatric: He has a normal  mood and affect. His behavior is normal. Judgment and thought content normal.  Nursing note and vitals reviewed.    ED Treatments / Results  Labs (all labs ordered are listed, but only abnormal results are displayed) Labs Reviewed  URINALYSIS, ROUTINE W REFLEX MICROSCOPIC - Abnormal; Notable for the following components:      Result Value   Glucose, UA 50 (*)    Hgb urine dipstick LARGE (*)    Ketones, ur 20 (*)    Bacteria, UA RARE (*)    All other components within normal limits  BASIC METABOLIC PANEL - Abnormal; Notable for the following components:   Sodium 130 (*)    Chloride 97 (*)    CO2 19 (*)    Glucose, Bld 153 (*)    Calcium 10.5 (*)    All other components within normal limits  URINE CULTURE  CBC    EKG  EKG Interpretation None       Radiology No results found.  Procedures Procedures (including critical care time)  Medications Ordered in ED Medications - No data to display   Initial Impression / Assessment and Plan / ED Course  I have reviewed the triage vital signs and the nursing notes.  Pertinent labs & imaging results that were available during my care of the patient were reviewed by me and considered in my medical decision making (see chart for details).      Patient Vitals for the past 24 hrs:  BP Temp Temp src Pulse Resp SpO2  07/30/17 1500 117/67 - - (!) 55 - 99 %  07/30/17 1430 (!) 163/72 - - 61 - 99 %  07/30/17 1415 (!) 160/98 - - (!) 54 - 100 %  07/30/17 1336 (!) 152/84 (!) 97.5 F (36.4 C) Oral 60 18 97 %  07/30/17 1222 (!) 209/75 97.8 F (36.6 C) Oral 62 18 96 %    3:56 PM Reevaluation with update and discussion. After initial assessment and treatment, an updated evaluation reveals no change in clinical status.  Findings discussed with patient and wife, all questions answered they understand; that he requires discharge with Foley catheter in place. Mancel Bale      Final Clinical Impressions(s) / ED Diagnoses   Final  diagnoses:  Urinary retention  Hematuria, unspecified type  Prostate hypertrophy   Prostate hypertrophy, cause not clear, urinalysis abnormal, possibly consistent with infection/prostatitis.  No associated fever, or weakness.  Nursing Notes Reviewed/ Care Coordinated Applicable Imaging Reviewed Interpretation of Laboratory Data incorporated into ED treatment  The patient appears reasonably screened and/or stabilized for discharge and I doubt any other medical condition or other [facility] requiring further screening, evaluation, or treatment in the ED at this time prior to discharge.  Plan: Home Medications-continue usual medications, Tylenol as needed; Home Treatments-Foley catheter care at home; return here if the recommended treatment, does not improve the  symptoms; Recommended follow up-PCP as needed.  Urology follow-up 1 week.   ED Discharge Orders        Ordered    ciprofloxacin (CIPRO) 500 MG tablet  2 times daily     07/30/17 1554       Mancel Bale, MD 07/30/17 1601

## 2017-07-31 LAB — URINE CULTURE: Culture: NO GROWTH

## 2017-08-07 DIAGNOSIS — R338 Other retention of urine: Secondary | ICD-10-CM | POA: Diagnosis not present

## 2017-08-08 DIAGNOSIS — R338 Other retention of urine: Secondary | ICD-10-CM | POA: Diagnosis not present

## 2017-10-07 DIAGNOSIS — E7800 Pure hypercholesterolemia, unspecified: Secondary | ICD-10-CM | POA: Diagnosis not present

## 2017-10-07 DIAGNOSIS — E559 Vitamin D deficiency, unspecified: Secondary | ICD-10-CM | POA: Diagnosis not present

## 2017-10-07 DIAGNOSIS — E119 Type 2 diabetes mellitus without complications: Secondary | ICD-10-CM | POA: Diagnosis not present

## 2017-10-09 DIAGNOSIS — E119 Type 2 diabetes mellitus without complications: Secondary | ICD-10-CM | POA: Diagnosis not present

## 2017-10-09 DIAGNOSIS — I10 Essential (primary) hypertension: Secondary | ICD-10-CM | POA: Diagnosis not present

## 2017-10-14 DIAGNOSIS — I10 Essential (primary) hypertension: Secondary | ICD-10-CM | POA: Diagnosis not present

## 2017-10-14 DIAGNOSIS — E559 Vitamin D deficiency, unspecified: Secondary | ICD-10-CM | POA: Diagnosis not present

## 2017-10-14 DIAGNOSIS — E1122 Type 2 diabetes mellitus with diabetic chronic kidney disease: Secondary | ICD-10-CM | POA: Diagnosis not present

## 2017-10-14 DIAGNOSIS — E785 Hyperlipidemia, unspecified: Secondary | ICD-10-CM | POA: Diagnosis not present

## 2017-11-06 DIAGNOSIS — Z125 Encounter for screening for malignant neoplasm of prostate: Secondary | ICD-10-CM | POA: Diagnosis not present

## 2017-11-13 DIAGNOSIS — R3914 Feeling of incomplete bladder emptying: Secondary | ICD-10-CM | POA: Diagnosis not present

## 2017-11-13 DIAGNOSIS — Z125 Encounter for screening for malignant neoplasm of prostate: Secondary | ICD-10-CM | POA: Diagnosis not present

## 2018-01-07 DIAGNOSIS — E559 Vitamin D deficiency, unspecified: Secondary | ICD-10-CM | POA: Diagnosis not present

## 2018-01-07 DIAGNOSIS — I10 Essential (primary) hypertension: Secondary | ICD-10-CM | POA: Diagnosis not present

## 2018-01-07 DIAGNOSIS — E1122 Type 2 diabetes mellitus with diabetic chronic kidney disease: Secondary | ICD-10-CM | POA: Diagnosis not present

## 2018-01-09 DIAGNOSIS — E559 Vitamin D deficiency, unspecified: Secondary | ICD-10-CM | POA: Diagnosis not present

## 2018-01-09 DIAGNOSIS — I10 Essential (primary) hypertension: Secondary | ICD-10-CM | POA: Diagnosis not present

## 2018-01-09 DIAGNOSIS — E119 Type 2 diabetes mellitus without complications: Secondary | ICD-10-CM | POA: Diagnosis not present

## 2018-01-09 DIAGNOSIS — E7800 Pure hypercholesterolemia, unspecified: Secondary | ICD-10-CM | POA: Diagnosis not present

## 2018-01-15 DIAGNOSIS — E1122 Type 2 diabetes mellitus with diabetic chronic kidney disease: Secondary | ICD-10-CM | POA: Diagnosis not present

## 2018-01-15 DIAGNOSIS — Z0000 Encounter for general adult medical examination without abnormal findings: Secondary | ICD-10-CM | POA: Diagnosis not present

## 2018-01-15 DIAGNOSIS — E785 Hyperlipidemia, unspecified: Secondary | ICD-10-CM | POA: Diagnosis not present

## 2018-01-15 DIAGNOSIS — Z125 Encounter for screening for malignant neoplasm of prostate: Secondary | ICD-10-CM | POA: Diagnosis not present

## 2018-01-15 DIAGNOSIS — E538 Deficiency of other specified B group vitamins: Secondary | ICD-10-CM | POA: Diagnosis not present

## 2018-01-15 DIAGNOSIS — I10 Essential (primary) hypertension: Secondary | ICD-10-CM | POA: Diagnosis not present

## 2018-01-15 DIAGNOSIS — Z8639 Personal history of other endocrine, nutritional and metabolic disease: Secondary | ICD-10-CM | POA: Diagnosis not present

## 2018-04-10 DIAGNOSIS — E7800 Pure hypercholesterolemia, unspecified: Secondary | ICD-10-CM | POA: Diagnosis not present

## 2018-04-10 DIAGNOSIS — E119 Type 2 diabetes mellitus without complications: Secondary | ICD-10-CM | POA: Diagnosis not present

## 2018-04-10 DIAGNOSIS — E559 Vitamin D deficiency, unspecified: Secondary | ICD-10-CM | POA: Diagnosis not present

## 2018-04-14 DIAGNOSIS — E785 Hyperlipidemia, unspecified: Secondary | ICD-10-CM | POA: Diagnosis not present

## 2018-04-14 DIAGNOSIS — E1122 Type 2 diabetes mellitus with diabetic chronic kidney disease: Secondary | ICD-10-CM | POA: Diagnosis not present

## 2018-04-14 DIAGNOSIS — E559 Vitamin D deficiency, unspecified: Secondary | ICD-10-CM | POA: Diagnosis not present

## 2018-04-14 DIAGNOSIS — I129 Hypertensive chronic kidney disease with stage 1 through stage 4 chronic kidney disease, or unspecified chronic kidney disease: Secondary | ICD-10-CM | POA: Diagnosis not present

## 2018-05-28 DIAGNOSIS — E119 Type 2 diabetes mellitus without complications: Secondary | ICD-10-CM | POA: Diagnosis not present

## 2018-05-28 DIAGNOSIS — H2513 Age-related nuclear cataract, bilateral: Secondary | ICD-10-CM | POA: Diagnosis not present

## 2018-05-28 DIAGNOSIS — H43823 Vitreomacular adhesion, bilateral: Secondary | ICD-10-CM | POA: Diagnosis not present

## 2018-06-09 DIAGNOSIS — E538 Deficiency of other specified B group vitamins: Secondary | ICD-10-CM | POA: Diagnosis not present

## 2018-06-09 DIAGNOSIS — E1122 Type 2 diabetes mellitus with diabetic chronic kidney disease: Secondary | ICD-10-CM | POA: Diagnosis not present

## 2018-06-09 DIAGNOSIS — I10 Essential (primary) hypertension: Secondary | ICD-10-CM | POA: Diagnosis not present

## 2018-06-09 DIAGNOSIS — Z8639 Personal history of other endocrine, nutritional and metabolic disease: Secondary | ICD-10-CM | POA: Diagnosis not present

## 2018-06-09 DIAGNOSIS — M8580 Other specified disorders of bone density and structure, unspecified site: Secondary | ICD-10-CM | POA: Diagnosis not present

## 2018-06-09 DIAGNOSIS — E785 Hyperlipidemia, unspecified: Secondary | ICD-10-CM | POA: Diagnosis not present

## 2018-06-19 DIAGNOSIS — I129 Hypertensive chronic kidney disease with stage 1 through stage 4 chronic kidney disease, or unspecified chronic kidney disease: Secondary | ICD-10-CM | POA: Diagnosis not present

## 2018-06-19 DIAGNOSIS — R011 Cardiac murmur, unspecified: Secondary | ICD-10-CM | POA: Diagnosis not present

## 2018-06-19 DIAGNOSIS — N182 Chronic kidney disease, stage 2 (mild): Secondary | ICD-10-CM | POA: Diagnosis not present

## 2018-06-19 DIAGNOSIS — E1122 Type 2 diabetes mellitus with diabetic chronic kidney disease: Secondary | ICD-10-CM | POA: Diagnosis not present

## 2018-06-19 DIAGNOSIS — E785 Hyperlipidemia, unspecified: Secondary | ICD-10-CM | POA: Diagnosis not present

## 2018-06-19 DIAGNOSIS — Z125 Encounter for screening for malignant neoplasm of prostate: Secondary | ICD-10-CM | POA: Diagnosis not present

## 2018-06-19 DIAGNOSIS — E559 Vitamin D deficiency, unspecified: Secondary | ICD-10-CM | POA: Diagnosis not present

## 2018-09-22 DIAGNOSIS — E1122 Type 2 diabetes mellitus with diabetic chronic kidney disease: Secondary | ICD-10-CM | POA: Diagnosis not present

## 2018-09-22 DIAGNOSIS — E559 Vitamin D deficiency, unspecified: Secondary | ICD-10-CM | POA: Diagnosis not present

## 2018-09-24 DIAGNOSIS — I129 Hypertensive chronic kidney disease with stage 1 through stage 4 chronic kidney disease, or unspecified chronic kidney disease: Secondary | ICD-10-CM | POA: Diagnosis not present

## 2018-09-24 DIAGNOSIS — E559 Vitamin D deficiency, unspecified: Secondary | ICD-10-CM | POA: Diagnosis not present

## 2018-09-24 DIAGNOSIS — E785 Hyperlipidemia, unspecified: Secondary | ICD-10-CM | POA: Diagnosis not present

## 2018-09-24 DIAGNOSIS — E1122 Type 2 diabetes mellitus with diabetic chronic kidney disease: Secondary | ICD-10-CM | POA: Diagnosis not present

## 2018-12-18 DIAGNOSIS — E785 Hyperlipidemia, unspecified: Secondary | ICD-10-CM | POA: Diagnosis not present

## 2018-12-18 DIAGNOSIS — E538 Deficiency of other specified B group vitamins: Secondary | ICD-10-CM | POA: Diagnosis not present

## 2018-12-18 DIAGNOSIS — M109 Gout, unspecified: Secondary | ICD-10-CM | POA: Diagnosis not present

## 2018-12-18 DIAGNOSIS — E559 Vitamin D deficiency, unspecified: Secondary | ICD-10-CM | POA: Diagnosis not present

## 2018-12-18 DIAGNOSIS — E1122 Type 2 diabetes mellitus with diabetic chronic kidney disease: Secondary | ICD-10-CM | POA: Diagnosis not present

## 2018-12-25 DIAGNOSIS — I129 Hypertensive chronic kidney disease with stage 1 through stage 4 chronic kidney disease, or unspecified chronic kidney disease: Secondary | ICD-10-CM | POA: Diagnosis not present

## 2018-12-25 DIAGNOSIS — K219 Gastro-esophageal reflux disease without esophagitis: Secondary | ICD-10-CM | POA: Diagnosis not present

## 2018-12-25 DIAGNOSIS — M109 Gout, unspecified: Secondary | ICD-10-CM | POA: Diagnosis not present

## 2018-12-25 DIAGNOSIS — R011 Cardiac murmur, unspecified: Secondary | ICD-10-CM | POA: Diagnosis not present

## 2018-12-25 DIAGNOSIS — E1122 Type 2 diabetes mellitus with diabetic chronic kidney disease: Secondary | ICD-10-CM | POA: Diagnosis not present

## 2018-12-25 DIAGNOSIS — F419 Anxiety disorder, unspecified: Secondary | ICD-10-CM | POA: Diagnosis not present

## 2018-12-25 DIAGNOSIS — M8580 Other specified disorders of bone density and structure, unspecified site: Secondary | ICD-10-CM | POA: Diagnosis not present

## 2018-12-25 DIAGNOSIS — N182 Chronic kidney disease, stage 2 (mild): Secondary | ICD-10-CM | POA: Diagnosis not present

## 2018-12-25 DIAGNOSIS — E785 Hyperlipidemia, unspecified: Secondary | ICD-10-CM | POA: Diagnosis not present

## 2019-01-07 DIAGNOSIS — Z125 Encounter for screening for malignant neoplasm of prostate: Secondary | ICD-10-CM | POA: Diagnosis not present

## 2019-01-14 DIAGNOSIS — R3914 Feeling of incomplete bladder emptying: Secondary | ICD-10-CM | POA: Diagnosis not present

## 2019-01-14 DIAGNOSIS — R972 Elevated prostate specific antigen [PSA]: Secondary | ICD-10-CM | POA: Diagnosis not present

## 2019-01-14 DIAGNOSIS — N401 Enlarged prostate with lower urinary tract symptoms: Secondary | ICD-10-CM | POA: Diagnosis not present

## 2019-01-14 DIAGNOSIS — Z87442 Personal history of urinary calculi: Secondary | ICD-10-CM | POA: Diagnosis not present

## 2019-06-18 DIAGNOSIS — I10 Essential (primary) hypertension: Secondary | ICD-10-CM | POA: Diagnosis not present

## 2019-06-18 DIAGNOSIS — Z125 Encounter for screening for malignant neoplasm of prostate: Secondary | ICD-10-CM | POA: Diagnosis not present

## 2019-06-18 DIAGNOSIS — Z0000 Encounter for general adult medical examination without abnormal findings: Secondary | ICD-10-CM | POA: Diagnosis not present

## 2019-06-18 DIAGNOSIS — E1122 Type 2 diabetes mellitus with diabetic chronic kidney disease: Secondary | ICD-10-CM | POA: Diagnosis not present

## 2019-06-18 DIAGNOSIS — E785 Hyperlipidemia, unspecified: Secondary | ICD-10-CM | POA: Diagnosis not present

## 2019-06-18 DIAGNOSIS — E559 Vitamin D deficiency, unspecified: Secondary | ICD-10-CM | POA: Diagnosis not present

## 2019-09-23 DIAGNOSIS — I10 Essential (primary) hypertension: Secondary | ICD-10-CM | POA: Diagnosis not present

## 2019-09-23 DIAGNOSIS — E559 Vitamin D deficiency, unspecified: Secondary | ICD-10-CM | POA: Diagnosis not present

## 2019-09-23 DIAGNOSIS — E785 Hyperlipidemia, unspecified: Secondary | ICD-10-CM | POA: Diagnosis not present

## 2019-09-23 DIAGNOSIS — Z0000 Encounter for general adult medical examination without abnormal findings: Secondary | ICD-10-CM | POA: Diagnosis not present

## 2019-09-23 DIAGNOSIS — M109 Gout, unspecified: Secondary | ICD-10-CM | POA: Diagnosis not present

## 2019-09-23 DIAGNOSIS — I129 Hypertensive chronic kidney disease with stage 1 through stage 4 chronic kidney disease, or unspecified chronic kidney disease: Secondary | ICD-10-CM | POA: Diagnosis not present

## 2019-09-23 DIAGNOSIS — K219 Gastro-esophageal reflux disease without esophagitis: Secondary | ICD-10-CM | POA: Diagnosis not present

## 2019-09-23 DIAGNOSIS — E1122 Type 2 diabetes mellitus with diabetic chronic kidney disease: Secondary | ICD-10-CM | POA: Diagnosis not present

## 2019-12-01 ENCOUNTER — Encounter (INDEPENDENT_AMBULATORY_CARE_PROVIDER_SITE_OTHER): Payer: Medicare HMO | Admitting: Ophthalmology

## 2019-12-02 ENCOUNTER — Other Ambulatory Visit: Payer: Self-pay

## 2019-12-02 ENCOUNTER — Encounter (INDEPENDENT_AMBULATORY_CARE_PROVIDER_SITE_OTHER): Payer: Self-pay | Admitting: Ophthalmology

## 2019-12-02 ENCOUNTER — Ambulatory Visit (INDEPENDENT_AMBULATORY_CARE_PROVIDER_SITE_OTHER): Payer: Medicare HMO | Admitting: Ophthalmology

## 2019-12-02 DIAGNOSIS — H43823 Vitreomacular adhesion, bilateral: Secondary | ICD-10-CM | POA: Diagnosis not present

## 2019-12-02 DIAGNOSIS — E119 Type 2 diabetes mellitus without complications: Secondary | ICD-10-CM | POA: Diagnosis not present

## 2019-12-02 DIAGNOSIS — H2513 Age-related nuclear cataract, bilateral: Secondary | ICD-10-CM | POA: Diagnosis not present

## 2019-12-02 DIAGNOSIS — E113292 Type 2 diabetes mellitus with mild nonproliferative diabetic retinopathy without macular edema, left eye: Secondary | ICD-10-CM | POA: Insufficient documentation

## 2019-12-02 NOTE — Progress Notes (Signed)
12/02/2019     CHIEF COMPLAINT Patient presents for Diabetic Eye Exam   HISTORY OF PRESENT ILLNESS: Michael Tran is a 74 y.o. male who presents to the clinic today for:   HPI    Diabetic Eye Exam    Vision is stable.  Diabetes characteristics include Type 2 and taking oral medications.  This started years ago.  Blood sugar level is controlled.  Last Blood Glucose: Pt does not recall.  Last A1C: Pt does not recall. "7.2 maybe".          Comments    1 1/2 Year Diabetic Exam OU  Pt denies noticeable changes to [place] OU since last visit. Pt denies ocular pain, flashes of light, or floaters OU.  LBS: pt does not recall A1c: pt does not recall, "7.2 maybe"       Last edited by Ileana Roup, COA on 12/02/2019  9:15 AM. (History)      Referring physician: [name], MD No address on file  HISTORICAL INFORMATION:   Selected notes from the MEDICAL RECORD NUMBER    Lab Results  Component Value Date   HGBA1C (H) 04/23/2007    6.6 (NOTE)   The ADA recommends the following therapeutic goals for glycemic   control related to Hgb A1C measurement:   Goal of Therapy:   < 7.0% Hgb A1C   Action Suggested:  > 8.0% Hgb A1C   Ref:  Diabetes Care, 22, Suppl. 1, 1999     CURRENT MEDICATIONS: No current outpatient medications on file. (Ophthalmic Drugs)   No current facility-administered medications for this visit. (Ophthalmic Drugs)   Current Outpatient Medications (Other)  Medication Sig  . acetaminophen (TYLENOL) 325 MG tablet Take 325 mg by mouth every 6 (six) hours as needed (for headaches).  Marland Kitchen aspirin EC 81 MG tablet Take 81 mg by mouth at bedtime.  Marland Kitchen atenolol (TENORMIN) 25 MG tablet Take 25 mg by mouth at bedtime.   . Cholecalciferol (VITAMIN D-3) 1000 units CAPS Take 1,000 Units by mouth 2 (two) times daily.  . ciprofloxacin (CIPRO) 500 MG tablet Take 1 tablet (500 mg total) by mouth 2 (two) times daily. One po bid x 7 days  . glipiZIDE (GLUCOTROL) 5 MG tablet Take 5 mg by  mouth 2 (two) times daily before a meal.  . lisinopril (PRINIVIL,ZESTRIL) 20 MG tablet Take 20 mg by mouth daily.  . Magnesium 250 MG TABS Take 250 mg by mouth daily with breakfast.  . metFORMIN (GLUCOPHAGE) 1000 MG tablet Take 1,000 mg by mouth 2 (two) times daily with a meal.  . pioglitazone (ACTOS) 15 MG tablet Take 15 mg by mouth daily.  . simvastatin (ZOCOR) 40 MG tablet Take 40 mg by mouth at bedtime.  . tamsulosin (FLOMAX) 0.4 MG CAPS capsule Take 0.4 mg by mouth at bedtime.   No current facility-administered medications for this visit. (Other)      REVIEW OF SYSTEMS:    ALLERGIES Allergies  Allergen Reactions  . Erythromycin Other (See Comments)    Patient cannot recall reaction, but "cannot take it"    PAST MEDICAL HISTORY Past Medical History:  Diagnosis Date  . Diabetes mellitus without complication (HCC)   . Hypertension   . Kidney stones    History reviewed. No pertinent surgical history.  FAMILY HISTORY History reviewed. No pertinent family history.  SOCIAL HISTORY Social History   Tobacco Use  . Smoking status: Never Smoker  . Smokeless tobacco: Never Used  Substance  Use Topics  . Alcohol use: Not on file  . Drug use: No         OPHTHALMIC EXAM:  Base Eye Exam    Visual Acuity (ETDRS)      Right Left   Dist cc 20/40 -1 20/40   Dist ph cc 20/20 20/25   Correction: Glasses       Tonometry (Tonopen, 9:19 AM)      Right Left   Pressure 19 17       Pupils      Pupils Dark Light Shape React APD   Right PERRL 4 3 Round Brisk None   Left PERRL 4 3 Round Brisk None       Visual Fields (Counting fingers)      Left Right    Full Full       Extraocular Movement      Right Left    Full Full       Neuro/Psych    Oriented x3: Yes   Mood/Affect: Normal       Dilation    Both eyes: 1.0% Mydriacyl, 2.5% Phenylephrine @ 9:19 AM        Slit Lamp and Fundus Exam    External Exam      Right Left   External Normal Normal        Slit Lamp Exam      Right Left   Lids/Lashes Normal Normal   Conjunctiva/Sclera White and quiet White and quiet   Cornea Clear Clear   Anterior Chamber Deep and quiet Deep and quiet   Iris Round and reactive Round and reactive   Lens 2+ Nuclear sclerosis 2+ Nuclear sclerosis   Anterior Vitreous Normal Normal       Fundus Exam      Right Left   Posterior Vitreous Normal Normal   Disc Normal Normal   C/D Ratio 0.0 0.0   Macula Normal,, no drusen Normal,, no drusen   Vessels no DR no DR   Periphery Normal Normal          IMAGING AND PROCEDURES  Imaging and Procedures for 12/02/19  OCT, Retina - OU - Both Eyes       Right Eye Quality was good. Scan locations included subfoveal. Central Foveal Thickness: 258. Progression has been stable. Findings include vitreomacular adhesion .   Left Eye Quality was good. Scan locations included subfoveal. Central Foveal Thickness: 257. Progression has been stable. Findings include vitreomacular adhesion , vitreous traction.   Notes Vitreal macular adhesion the right eye is minor.  Vitreal macular adhesion the left eye is all in the foveal region with only minimal inner retinal tractional changes, observe                ASSESSMENT/PLAN:  Diabetes mellitus without complication ([place]) The patient has diabetes without any evidence of retinopathy. The patient advised to maintain good blood glucose control, excellent blood pressure control, and favorable levels of cholesterol, low density lipoprotein, and high density lipoproteins. Follow up in 1 year was recommended. Explained that fluctuations in visual acuity , or "out of focus", may result from large variations of blood sugar control.  Nuclear sclerotic cataract of both eyes The nature of cataract was discussed with the patient as well as the elective nature of surgery. The patient was reassured that surgery at a later date does not put the patient at risk for a worse outcome. It was  emphasized that the need for surgery is dictated by the  patient's quality of life as influenced by the cataract. Patient was instructed to maintain close follow up with their general eye care doctor.  Vitreomacular adhesion of both eyes Vitreomacular traction may cause vision loss from anatomic distortion to the center of the vision, the macula.  If visual function is symptomatic or threatened, therapy may be needed.  Surgical intervention offers the highest chance of visual stability and improvement.  Distortion of the macula anatomy may cause splitting of the retinal layers, termed foveomacular retinoschisis, which can cause more permanent vision loss.  Epiretinal membranes may also be associated.  Macular hole may also develop if vitreomacular traction progresses. The minor form of this condition is Vitreomacular adhesion, which is a natural change in the aging process of the eye, which requires observation only.   OD Minor, OS with only minimal change to the inner fovea observe      ICD-10-CM   1. Diabetes mellitus without complication (HCC)  E11.9 OCT, Retina - OU - Both Eyes  2. Nuclear sclerotic cataract of both eyes  H25.13   3. Vitreomacular adhesion of both eyes  H43.823     1.Vitreal macular adhesion the right eye is minor.  Vitreal macular adhesion the left eye is all in the foveal region with only minimal inner retinal tractional changes, observe  2.  3.  Ophthalmic Meds Ordered this visit:  No orders of the defined types were placed in this encounter.      Return in about 1 year (around 12/01/2020) for DILATE OU, COLOR FP, OCT.  There are no Patient Instructions on file for this visit.   Explained the diagnoses, plan, and follow up with the patient and they expressed understanding.  Patient expressed understanding of the importance of proper follow up care.   Alford Highland Millette Halberstam M.D. Diseases & Surgery of the Retina and Vitreous [facility] 12/02/19     Abbreviations: M myopia (nearsighted); A astigmatism; H hyperopia (farsighted); P presbyopia; Mrx spectacle prescription;  CTL contact lenses; OD right eye; OS left eye; OU both eyes  XT exotropia; ET esotropia; PEK punctate epithelial keratitis; PEE punctate epithelial erosions; DES dry eye syndrome; MGD meibomian gland dysfunction; ATs artificial tears; PFAT's preservative free artificial tears; NSC nuclear sclerotic cataract; PSC posterior subcapsular cataract; ERM epi-retinal membrane; PVD posterior vitreous detachment; RD retinal detachment; DM diabetes mellitus; DR diabetic retinopathy; NPDR non-proliferative diabetic retinopathy; PDR proliferative diabetic retinopathy; CSME clinically significant macular edema; DME diabetic macular edema; dbh dot blot hemorrhages; CWS cotton wool spot; POAG primary open angle glaucoma; C/D cup-to-disc ratio; HVF humphrey visual field; GVF goldmann visual field; OCT optical coherence tomography; IOP intraocular pressure; BRVO Branch retinal vein occlusion; CRVO central retinal vein occlusion; CRAO central retinal artery occlusion; BRAO branch retinal artery occlusion; RT retinal tear; SB scleral buckle; PPV pars plana vitrectomy; VH Vitreous hemorrhage; PRP panretinal laser photocoagulation; IVK intravitreal kenalog; VMT vitreomacular traction; MH Macular hole;  NVD neovascularization of the disc; NVE neovascularization elsewhere; AREDS age related eye disease study; ARMD age related macular degeneration; POAG primary open angle glaucoma; EBMD epithelial/anterior basement membrane dystrophy; ACIOL anterior chamber intraocular lens; IOL intraocular lens; PCIOL posterior chamber intraocular lens; Phaco/IOL phacoemulsification with intraocular lens placement; PRK photorefractive keratectomy; LASIK laser assisted in situ keratomileusis; HTN hypertension; DM diabetes mellitus; COPD chronic obstructive pulmonary disease

## 2019-12-02 NOTE — Assessment & Plan Note (Signed)
Vitreomacular traction may cause vision loss from anatomic distortion to the center of the vision, the macula.  If visual function is symptomatic or threatened, therapy may be needed.  Surgical intervention offers the highest chance of visual stability and improvement.  Distortion of the macula anatomy may cause splitting of the retinal layers, termed foveomacular retinoschisis, which can cause more permanent vision loss.  Epiretinal membranes may also be associated.  Macular hole may also develop if vitreomacular traction progresses. The minor form of this condition is Vitreomacular adhesion, which is a natural change in the aging process of the eye, which requires observation only.   OD Minor, OS with only minimal change to the inner fovea observe

## 2019-12-02 NOTE — Assessment & Plan Note (Signed)

## 2019-12-02 NOTE — Assessment & Plan Note (Signed)

## 2020-01-21 DIAGNOSIS — R972 Elevated prostate specific antigen [PSA]: Secondary | ICD-10-CM | POA: Diagnosis not present

## 2020-01-29 DIAGNOSIS — R3914 Feeling of incomplete bladder emptying: Secondary | ICD-10-CM | POA: Diagnosis not present

## 2020-01-29 DIAGNOSIS — N401 Enlarged prostate with lower urinary tract symptoms: Secondary | ICD-10-CM | POA: Diagnosis not present

## 2020-01-29 DIAGNOSIS — R972 Elevated prostate specific antigen [PSA]: Secondary | ICD-10-CM | POA: Diagnosis not present

## 2020-02-01 DIAGNOSIS — E119 Type 2 diabetes mellitus without complications: Secondary | ICD-10-CM | POA: Diagnosis not present

## 2020-02-01 DIAGNOSIS — R195 Other fecal abnormalities: Secondary | ICD-10-CM | POA: Diagnosis not present

## 2020-02-01 DIAGNOSIS — I10 Essential (primary) hypertension: Secondary | ICD-10-CM | POA: Diagnosis not present

## 2020-02-09 DIAGNOSIS — R195 Other fecal abnormalities: Secondary | ICD-10-CM | POA: Diagnosis not present

## 2020-02-09 DIAGNOSIS — K635 Polyp of colon: Secondary | ICD-10-CM | POA: Diagnosis not present

## 2020-03-17 DIAGNOSIS — E1122 Type 2 diabetes mellitus with diabetic chronic kidney disease: Secondary | ICD-10-CM | POA: Diagnosis not present

## 2020-03-17 DIAGNOSIS — I129 Hypertensive chronic kidney disease with stage 1 through stage 4 chronic kidney disease, or unspecified chronic kidney disease: Secondary | ICD-10-CM | POA: Diagnosis not present

## 2020-03-24 DIAGNOSIS — E785 Hyperlipidemia, unspecified: Secondary | ICD-10-CM | POA: Diagnosis not present

## 2020-03-24 DIAGNOSIS — M109 Gout, unspecified: Secondary | ICD-10-CM | POA: Diagnosis not present

## 2020-03-24 DIAGNOSIS — I129 Hypertensive chronic kidney disease with stage 1 through stage 4 chronic kidney disease, or unspecified chronic kidney disease: Secondary | ICD-10-CM | POA: Diagnosis not present

## 2020-03-24 DIAGNOSIS — Z125 Encounter for screening for malignant neoplasm of prostate: Secondary | ICD-10-CM | POA: Diagnosis not present

## 2020-03-24 DIAGNOSIS — Z23 Encounter for immunization: Secondary | ICD-10-CM | POA: Diagnosis not present

## 2020-03-24 DIAGNOSIS — N400 Enlarged prostate without lower urinary tract symptoms: Secondary | ICD-10-CM | POA: Diagnosis not present

## 2020-03-24 DIAGNOSIS — E119 Type 2 diabetes mellitus without complications: Secondary | ICD-10-CM | POA: Diagnosis not present

## 2020-03-24 DIAGNOSIS — E559 Vitamin D deficiency, unspecified: Secondary | ICD-10-CM | POA: Diagnosis not present

## 2020-09-14 DIAGNOSIS — E559 Vitamin D deficiency, unspecified: Secondary | ICD-10-CM | POA: Diagnosis not present

## 2020-09-14 DIAGNOSIS — M109 Gout, unspecified: Secondary | ICD-10-CM | POA: Diagnosis not present

## 2020-09-14 DIAGNOSIS — Z125 Encounter for screening for malignant neoplasm of prostate: Secondary | ICD-10-CM | POA: Diagnosis not present

## 2020-09-14 DIAGNOSIS — I129 Hypertensive chronic kidney disease with stage 1 through stage 4 chronic kidney disease, or unspecified chronic kidney disease: Secondary | ICD-10-CM | POA: Diagnosis not present

## 2020-09-14 DIAGNOSIS — E119 Type 2 diabetes mellitus without complications: Secondary | ICD-10-CM | POA: Diagnosis not present

## 2020-09-14 DIAGNOSIS — E785 Hyperlipidemia, unspecified: Secondary | ICD-10-CM | POA: Diagnosis not present

## 2020-09-14 DIAGNOSIS — R7989 Other specified abnormal findings of blood chemistry: Secondary | ICD-10-CM | POA: Diagnosis not present

## 2020-09-21 DIAGNOSIS — I129 Hypertensive chronic kidney disease with stage 1 through stage 4 chronic kidney disease, or unspecified chronic kidney disease: Secondary | ICD-10-CM | POA: Diagnosis not present

## 2020-09-21 DIAGNOSIS — M109 Gout, unspecified: Secondary | ICD-10-CM | POA: Diagnosis not present

## 2020-09-21 DIAGNOSIS — N400 Enlarged prostate without lower urinary tract symptoms: Secondary | ICD-10-CM | POA: Diagnosis not present

## 2020-09-21 DIAGNOSIS — Z79899 Other long term (current) drug therapy: Secondary | ICD-10-CM | POA: Diagnosis not present

## 2020-09-21 DIAGNOSIS — E119 Type 2 diabetes mellitus without complications: Secondary | ICD-10-CM | POA: Diagnosis not present

## 2020-09-21 DIAGNOSIS — E785 Hyperlipidemia, unspecified: Secondary | ICD-10-CM | POA: Diagnosis not present

## 2020-12-01 ENCOUNTER — Encounter (INDEPENDENT_AMBULATORY_CARE_PROVIDER_SITE_OTHER): Payer: Medicare HMO | Admitting: Ophthalmology

## 2020-12-15 ENCOUNTER — Encounter (INDEPENDENT_AMBULATORY_CARE_PROVIDER_SITE_OTHER): Payer: Medicare HMO | Admitting: Ophthalmology

## 2020-12-27 ENCOUNTER — Encounter (INDEPENDENT_AMBULATORY_CARE_PROVIDER_SITE_OTHER): Payer: Self-pay | Admitting: Ophthalmology

## 2020-12-27 ENCOUNTER — Ambulatory Visit (INDEPENDENT_AMBULATORY_CARE_PROVIDER_SITE_OTHER): Payer: Medicare HMO | Admitting: Ophthalmology

## 2020-12-27 ENCOUNTER — Other Ambulatory Visit: Payer: Self-pay

## 2020-12-27 DIAGNOSIS — E119 Type 2 diabetes mellitus without complications: Secondary | ICD-10-CM

## 2020-12-27 DIAGNOSIS — H43823 Vitreomacular adhesion, bilateral: Secondary | ICD-10-CM

## 2020-12-27 DIAGNOSIS — E113292 Type 2 diabetes mellitus with mild nonproliferative diabetic retinopathy without macular edema, left eye: Secondary | ICD-10-CM | POA: Diagnosis not present

## 2020-12-27 NOTE — Assessment & Plan Note (Signed)
Vitreomacular traction may cause vision loss from anatomic distortion to the center of the vision, the macula.  If visual function is symptomatic or threatened, therapy may be needed.  Surgical intervention offers the highest chance of visual stability and improvement.  Distortion of the macula anatomy may cause splitting of the retinal layers, termed foveomacular retinoschisis, which can cause more permanent vision loss.  Epiretinal membranes may also be associated.  Macular hole may also develop if vitreomacular traction progresses. The minor form of this condition is Vitreomacular adhesion, which is a natural change in the aging process of the eye, which requires observation only.  OU currently physiologic with no Foveal distortion

## 2020-12-27 NOTE — Progress Notes (Signed)
12/27/2020     CHIEF COMPLAINT Patient presents for Retina Follow Up (1 year fu OU and OCT/Pt states [place] OU stable since last visit. Pt denies FOL, floaters, or ocular pain OU. Erroll Luna: Higher but I am not sure (7,8,9)/LBS:164)   HISTORY OF PRESENT ILLNESS: Michael Tran is a 75 y.o. male who presents to the clinic today for:   HPI     Retina Follow Up           Diagnosis: Other   Laterality: both eyes   Onset: 1 year ago   Severity: mild   Duration: 1 year   Course: stable   Comments: 1 year fu OU and OCT Pt states [place] OU stable since last visit. Pt denies FOL, floaters, or ocular pain OU.  A1C: Higher but I am not sure (7,8,9) LBS:164       Last edited by Demetrios Loll, COA on 12/27/2020  9:37 AM.      Referring physician: [name], MD No address on file  HISTORICAL INFORMATION:   Selected notes from the MEDICAL RECORD NUMBER    Lab Results  Component Value Date   HGBA1C (H) 04/23/2007    6.6 (NOTE)   The ADA recommends the following therapeutic goals for glycemic   control related to Hgb A1C measurement:   Goal of Therapy:   < 7.0% Hgb A1C   Action Suggested:  > 8.0% Hgb A1C   Ref:  Diabetes Care, 22, Suppl. 1, 1999     CURRENT MEDICATIONS: No current outpatient medications on file. (Ophthalmic Drugs)   No current facility-administered medications for this visit. (Ophthalmic Drugs)   Current Outpatient Medications (Other)  Medication Sig   acetaminophen (TYLENOL) 325 MG tablet Take 325 mg by mouth every 6 (six) hours as needed (for headaches).   aspirin EC 81 MG tablet Take 81 mg by mouth at bedtime.   atenolol (TENORMIN) 25 MG tablet Take 25 mg by mouth at bedtime.    Cholecalciferol (VITAMIN D-3) 1000 units CAPS Take 1,000 Units by mouth 2 (two) times daily.   ciprofloxacin (CIPRO) 500 MG tablet Take 1 tablet (500 mg total) by mouth 2 (two) times daily. One po bid x 7 days   glipiZIDE (GLUCOTROL) 5 MG tablet Take 5 mg by mouth 2 (two) times daily  before a meal.   lisinopril (PRINIVIL,ZESTRIL) 20 MG tablet Take 20 mg by mouth daily.   Magnesium 250 MG TABS Take 250 mg by mouth daily with breakfast.   metFORMIN (GLUCOPHAGE) 1000 MG tablet Take 1,000 mg by mouth 2 (two) times daily with a meal.   pioglitazone (ACTOS) 15 MG tablet Take 15 mg by mouth daily.   simvastatin (ZOCOR) 40 MG tablet Take 40 mg by mouth at bedtime.   tamsulosin (FLOMAX) 0.4 MG CAPS capsule Take 0.4 mg by mouth at bedtime.   No current facility-administered medications for this visit. (Other)      REVIEW OF SYSTEMS:    ALLERGIES Allergies  Allergen Reactions   Erythromycin Other (See Comments)    Patient cannot recall reaction, but "cannot take it"    PAST MEDICAL HISTORY Past Medical History:  Diagnosis Date   Diabetes mellitus without complication (HCC)    Hypertension    Kidney stones    History reviewed. No pertinent surgical history.  FAMILY HISTORY History reviewed. No pertinent family history.  SOCIAL HISTORY Social History   Tobacco Use   Smoking status: Never   Smokeless tobacco: Never  Substance Use Topics   Drug use: No         OPHTHALMIC EXAM:  Base Eye Exam     Visual Acuity (ETDRS)       Right Left   Dist cc 20/60 20/50 -1   Dist ph cc 20/25 20/25 -2         Tonometry (Tonopen, 9:41 AM)       Right Left   Pressure 19 18         Pupils       Pupils Dark Light Shape React APD   Right PERRL 4 3 Round Brisk None   Left PERRL 4 3 Round Brisk None         Visual Fields (Counting fingers)       Left Right    Full Full         Extraocular Movement       Right Left    Full Full         Neuro/Psych     Oriented x3: Yes   Mood/Affect: Normal         Dilation     Both eyes: 1.0% Mydriacyl, 2.5% Phenylephrine @ 9:41 AM           Slit Lamp and Fundus Exam     External Exam       Right Left   External Normal Normal         Slit Lamp Exam       Right Left    Lids/Lashes Normal Normal   Conjunctiva/Sclera White and quiet White and quiet   Cornea Clear Clear   Anterior Chamber Deep and quiet Deep and quiet   Iris Round and reactive Round and reactive   Lens 2+ Nuclear sclerosis 2+ Nuclear sclerosis   Anterior Vitreous Normal Normal         Fundus Exam       Right Left   Posterior Vitreous Normal Normal   Disc Normal Normal   C/D Ratio 0.0 0.0   Macula Normal,, no drusen ,, no drusen, Microaneurysm on temporal edge of FAZ   Vessels no DR NPDR- Mild manifested by solitary microaneurysm temporal FAZ   Periphery Normal Normal            IMAGING AND PROCEDURES  Imaging and Procedures for 12/27/20  OCT, Retina - OU - Both Eyes       Right Eye Quality was good. Scan locations included subfoveal. Central Foveal Thickness: 259. Progression has been stable. Findings include vitreomacular adhesion .   Left Eye Quality was good. Scan locations included subfoveal. Central Foveal Thickness: 245. Progression has been stable. Findings include vitreomacular adhesion , vitreous traction.   Notes Vitreal macular adhesion the right eye is minor.  Vitreal macular adhesion the left eye is all in the foveal region with only minimal inner retinal tractional changes, observe             ASSESSMENT/PLAN:  Mild nonproliferative diabetic retinopathy of left eye (HCC) The nature of mild nonproliferative diabetic retinopathy was discussed with the patient. Emphasis was placed on tight glucose, blood pressure, and serum lipid control. Avoidance of smoking was emphasized. Maintenance of normal body weight was emphasized. Appropriate follow up dilated exam is 1 year.  Vitreomacular adhesion of both eyes Vitreomacular traction may cause vision loss from anatomic distortion to the center of the vision, the macula.  If visual function is symptomatic or threatened, therapy may be needed.  Surgical intervention offers  the highest chance of visual  stability and improvement.  Distortion of the macula anatomy may cause splitting of the retinal layers, termed foveomacular retinoschisis, which can cause more permanent vision loss.  Epiretinal membranes may also be associated.  Macular hole may also develop if vitreomacular traction progresses. The minor form of this condition is Vitreomacular adhesion, which is a natural change in the aging process of the eye, which requires observation only.  OU currently physiologic with no Foveal distortion      ICD-10-CM   1. Diabetes mellitus without complication (HCC)  E11.9 OCT, Retina - OU - Both Eyes    2. Vitreomacular adhesion of both eyes  H43.823 OCT, Retina - OU - Both Eyes    3. Mild nonproliferative diabetic retinopathy of left eye without macular edema associated with type 2 diabetes mellitus (HCC)  J19.1478       1.  Continue with excellent blood sugar control to maintain and prevent progression of diabetic retinopathy OU.  2.  Nuclear sclerotic cataract likely start beginning to have trouble with nighttime vision or dark rainy days in the near future  3.  Ophthalmic Meds Ordered this visit:  No orders of the defined types were placed in this encounter.      Return in about 1 year (around 12/27/2021) for DILATE OU, OCT.  There are no Patient Instructions on file for this visit.   Explained the diagnoses, plan, and follow up with the patient and they expressed understanding.  Patient expressed understanding of the importance of proper follow up care.   Alford Highland Slyvia Lartigue M.D. Diseases & Surgery of the Retina and Vitreous [facility] 12/27/20     Abbreviations: M myopia (nearsighted); A astigmatism; H hyperopia (farsighted); P presbyopia; Mrx spectacle prescription;  CTL contact lenses; OD right eye; OS left eye; OU both eyes  XT exotropia; ET esotropia; PEK punctate epithelial keratitis; PEE punctate epithelial erosions; DES dry eye syndrome; MGD meibomian  gland dysfunction; ATs artificial tears; PFAT's preservative free artificial tears; NSC nuclear sclerotic cataract; PSC posterior subcapsular cataract; ERM epi-retinal membrane; PVD posterior vitreous detachment; RD retinal detachment; DM diabetes mellitus; DR diabetic retinopathy; NPDR non-proliferative diabetic retinopathy; PDR proliferative diabetic retinopathy; CSME clinically significant macular edema; DME diabetic macular edema; dbh dot blot hemorrhages; CWS cotton wool spot; POAG primary open angle glaucoma; C/D cup-to-disc ratio; HVF humphrey visual field; GVF goldmann visual field; OCT optical coherence tomography; IOP intraocular pressure; BRVO Branch retinal vein occlusion; CRVO central retinal vein occlusion; CRAO central retinal artery occlusion; BRAO branch retinal artery occlusion; RT retinal tear; SB scleral buckle; PPV pars plana vitrectomy; VH Vitreous hemorrhage; PRP panretinal laser photocoagulation; IVK intravitreal kenalog; VMT vitreomacular traction; MH Macular hole;  NVD neovascularization of the disc; NVE neovascularization elsewhere; AREDS age related eye disease study; ARMD age related macular degeneration; POAG primary open angle glaucoma; EBMD epithelial/anterior basement membrane dystrophy; ACIOL anterior chamber intraocular lens; IOL intraocular lens; PCIOL posterior chamber intraocular lens; Phaco/IOL phacoemulsification with intraocular lens placement; PRK photorefractive keratectomy; LASIK laser assisted in situ keratomileusis; HTN hypertension; DM diabetes mellitus; COPD chronic obstructive pulmonary disease

## 2020-12-27 NOTE — Assessment & Plan Note (Signed)
The nature of mild nonproliferative diabetic retinopathy was discussed with the patient. Emphasis was placed on tight glucose, blood pressure, and serum lipid control. Avoidance of smoking was emphasized. Maintenance of normal body weight was emphasized. Appropriate follow up dilated exam is 1 year. 

## 2021-01-30 DIAGNOSIS — R972 Elevated prostate specific antigen [PSA]: Secondary | ICD-10-CM | POA: Diagnosis not present

## 2021-02-06 DIAGNOSIS — N476 Balanoposthitis: Secondary | ICD-10-CM | POA: Diagnosis not present

## 2021-02-06 DIAGNOSIS — R3914 Feeling of incomplete bladder emptying: Secondary | ICD-10-CM | POA: Diagnosis not present

## 2021-02-06 DIAGNOSIS — R351 Nocturia: Secondary | ICD-10-CM | POA: Diagnosis not present

## 2021-02-06 DIAGNOSIS — N401 Enlarged prostate with lower urinary tract symptoms: Secondary | ICD-10-CM | POA: Diagnosis not present

## 2021-02-06 DIAGNOSIS — N471 Phimosis: Secondary | ICD-10-CM | POA: Diagnosis not present

## 2021-02-07 DIAGNOSIS — E119 Type 2 diabetes mellitus without complications: Secondary | ICD-10-CM | POA: Diagnosis not present

## 2021-02-07 DIAGNOSIS — E785 Hyperlipidemia, unspecified: Secondary | ICD-10-CM | POA: Diagnosis not present

## 2021-02-07 DIAGNOSIS — Z79899 Other long term (current) drug therapy: Secondary | ICD-10-CM | POA: Diagnosis not present

## 2021-02-13 DIAGNOSIS — I129 Hypertensive chronic kidney disease with stage 1 through stage 4 chronic kidney disease, or unspecified chronic kidney disease: Secondary | ICD-10-CM | POA: Diagnosis not present

## 2021-02-13 DIAGNOSIS — E119 Type 2 diabetes mellitus without complications: Secondary | ICD-10-CM | POA: Diagnosis not present

## 2021-02-13 DIAGNOSIS — E559 Vitamin D deficiency, unspecified: Secondary | ICD-10-CM | POA: Diagnosis not present

## 2021-02-13 DIAGNOSIS — Z79899 Other long term (current) drug therapy: Secondary | ICD-10-CM | POA: Diagnosis not present

## 2021-02-13 DIAGNOSIS — Z0000 Encounter for general adult medical examination without abnormal findings: Secondary | ICD-10-CM | POA: Diagnosis not present

## 2021-02-13 DIAGNOSIS — E785 Hyperlipidemia, unspecified: Secondary | ICD-10-CM | POA: Diagnosis not present

## 2021-02-13 DIAGNOSIS — N400 Enlarged prostate without lower urinary tract symptoms: Secondary | ICD-10-CM | POA: Diagnosis not present

## 2021-03-08 DIAGNOSIS — E119 Type 2 diabetes mellitus without complications: Secondary | ICD-10-CM | POA: Diagnosis not present

## 2021-03-08 DIAGNOSIS — Z0100 Encounter for examination of eyes and vision without abnormal findings: Secondary | ICD-10-CM | POA: Diagnosis not present

## 2021-03-28 DIAGNOSIS — E785 Hyperlipidemia, unspecified: Secondary | ICD-10-CM | POA: Diagnosis not present

## 2021-03-28 DIAGNOSIS — Z23 Encounter for immunization: Secondary | ICD-10-CM | POA: Diagnosis not present

## 2021-03-28 DIAGNOSIS — Z79899 Other long term (current) drug therapy: Secondary | ICD-10-CM | POA: Diagnosis not present

## 2021-03-28 DIAGNOSIS — E118 Type 2 diabetes mellitus with unspecified complications: Secondary | ICD-10-CM | POA: Diagnosis not present

## 2021-07-04 DIAGNOSIS — R69 Illness, unspecified: Secondary | ICD-10-CM | POA: Diagnosis not present

## 2021-08-02 DIAGNOSIS — E785 Hyperlipidemia, unspecified: Secondary | ICD-10-CM | POA: Diagnosis not present

## 2021-08-02 DIAGNOSIS — E118 Type 2 diabetes mellitus with unspecified complications: Secondary | ICD-10-CM | POA: Diagnosis not present

## 2021-08-02 DIAGNOSIS — Z79899 Other long term (current) drug therapy: Secondary | ICD-10-CM | POA: Diagnosis not present

## 2021-08-09 DIAGNOSIS — I129 Hypertensive chronic kidney disease with stage 1 through stage 4 chronic kidney disease, or unspecified chronic kidney disease: Secondary | ICD-10-CM | POA: Diagnosis not present

## 2021-08-09 DIAGNOSIS — N400 Enlarged prostate without lower urinary tract symptoms: Secondary | ICD-10-CM | POA: Diagnosis not present

## 2021-08-09 DIAGNOSIS — M109 Gout, unspecified: Secondary | ICD-10-CM | POA: Diagnosis not present

## 2021-08-09 DIAGNOSIS — E785 Hyperlipidemia, unspecified: Secondary | ICD-10-CM | POA: Diagnosis not present

## 2021-08-09 DIAGNOSIS — E119 Type 2 diabetes mellitus without complications: Secondary | ICD-10-CM | POA: Diagnosis not present

## 2021-08-24 DIAGNOSIS — E119 Type 2 diabetes mellitus without complications: Secondary | ICD-10-CM | POA: Diagnosis not present

## 2021-08-24 DIAGNOSIS — E785 Hyperlipidemia, unspecified: Secondary | ICD-10-CM | POA: Diagnosis not present

## 2021-08-24 DIAGNOSIS — Z79899 Other long term (current) drug therapy: Secondary | ICD-10-CM | POA: Diagnosis not present

## 2021-11-23 DIAGNOSIS — E786 Lipoprotein deficiency: Secondary | ICD-10-CM | POA: Diagnosis not present

## 2021-11-23 DIAGNOSIS — Z79899 Other long term (current) drug therapy: Secondary | ICD-10-CM | POA: Diagnosis not present

## 2021-11-23 DIAGNOSIS — E119 Type 2 diabetes mellitus without complications: Secondary | ICD-10-CM | POA: Diagnosis not present

## 2021-11-23 DIAGNOSIS — E785 Hyperlipidemia, unspecified: Secondary | ICD-10-CM | POA: Diagnosis not present

## 2021-12-01 DIAGNOSIS — E785 Hyperlipidemia, unspecified: Secondary | ICD-10-CM | POA: Diagnosis not present

## 2021-12-01 DIAGNOSIS — M109 Gout, unspecified: Secondary | ICD-10-CM | POA: Diagnosis not present

## 2021-12-01 DIAGNOSIS — N400 Enlarged prostate without lower urinary tract symptoms: Secondary | ICD-10-CM | POA: Diagnosis not present

## 2021-12-01 DIAGNOSIS — E118 Type 2 diabetes mellitus with unspecified complications: Secondary | ICD-10-CM | POA: Diagnosis not present

## 2021-12-01 DIAGNOSIS — I129 Hypertensive chronic kidney disease with stage 1 through stage 4 chronic kidney disease, or unspecified chronic kidney disease: Secondary | ICD-10-CM | POA: Diagnosis not present

## 2021-12-19 DIAGNOSIS — Z125 Encounter for screening for malignant neoplasm of prostate: Secondary | ICD-10-CM | POA: Diagnosis not present

## 2021-12-19 DIAGNOSIS — I129 Hypertensive chronic kidney disease with stage 1 through stage 4 chronic kidney disease, or unspecified chronic kidney disease: Secondary | ICD-10-CM | POA: Diagnosis not present

## 2021-12-19 DIAGNOSIS — E559 Vitamin D deficiency, unspecified: Secondary | ICD-10-CM | POA: Diagnosis not present

## 2021-12-19 DIAGNOSIS — E785 Hyperlipidemia, unspecified: Secondary | ICD-10-CM | POA: Diagnosis not present

## 2021-12-19 DIAGNOSIS — N400 Enlarged prostate without lower urinary tract symptoms: Secondary | ICD-10-CM | POA: Diagnosis not present

## 2021-12-19 DIAGNOSIS — E118 Type 2 diabetes mellitus with unspecified complications: Secondary | ICD-10-CM | POA: Diagnosis not present

## 2021-12-19 DIAGNOSIS — M8580 Other specified disorders of bone density and structure, unspecified site: Secondary | ICD-10-CM | POA: Diagnosis not present

## 2021-12-19 DIAGNOSIS — R011 Cardiac murmur, unspecified: Secondary | ICD-10-CM | POA: Diagnosis not present

## 2021-12-19 DIAGNOSIS — R946 Abnormal results of thyroid function studies: Secondary | ICD-10-CM | POA: Diagnosis not present

## 2022-01-02 ENCOUNTER — Encounter (INDEPENDENT_AMBULATORY_CARE_PROVIDER_SITE_OTHER): Payer: Medicare HMO | Admitting: Ophthalmology

## 2022-01-24 ENCOUNTER — Ambulatory Visit (INDEPENDENT_AMBULATORY_CARE_PROVIDER_SITE_OTHER): Payer: Medicare HMO | Admitting: Ophthalmology

## 2022-01-24 ENCOUNTER — Encounter (INDEPENDENT_AMBULATORY_CARE_PROVIDER_SITE_OTHER): Payer: Self-pay | Admitting: Ophthalmology

## 2022-01-24 ENCOUNTER — Encounter (INDEPENDENT_AMBULATORY_CARE_PROVIDER_SITE_OTHER): Payer: Medicare HMO | Admitting: Ophthalmology

## 2022-01-24 ENCOUNTER — Encounter (INDEPENDENT_AMBULATORY_CARE_PROVIDER_SITE_OTHER): Payer: Self-pay

## 2022-01-24 DIAGNOSIS — H43813 Vitreous degeneration, bilateral: Secondary | ICD-10-CM | POA: Diagnosis not present

## 2022-01-24 DIAGNOSIS — H43823 Vitreomacular adhesion, bilateral: Secondary | ICD-10-CM | POA: Diagnosis not present

## 2022-01-24 DIAGNOSIS — E113292 Type 2 diabetes mellitus with mild nonproliferative diabetic retinopathy without macular edema, left eye: Secondary | ICD-10-CM | POA: Diagnosis not present

## 2022-01-24 DIAGNOSIS — E119 Type 2 diabetes mellitus without complications: Secondary | ICD-10-CM | POA: Diagnosis not present

## 2022-01-24 DIAGNOSIS — H2513 Age-related nuclear cataract, bilateral: Secondary | ICD-10-CM

## 2022-01-24 NOTE — Assessment & Plan Note (Signed)

## 2022-01-24 NOTE — Assessment & Plan Note (Addendum)
The nature of mild nonproliferative diabetic retinopathy was discussed with the patient. Emphasis was placed on tight glucose, blood pressure, and serum lipid control. Avoidance of smoking was emphasized. Maintenance of normal body weight was emphasized. Appropriate follow up dilated exam is 1 year.  With cataracts we will follow-up in 9 months No change OS over time

## 2022-01-24 NOTE — Assessment & Plan Note (Addendum)
Bilateral physiologic no impact on acuity though progressing

## 2022-01-24 NOTE — Assessment & Plan Note (Signed)
Physiologic resolution of the PVD

## 2022-01-24 NOTE — Progress Notes (Signed)
01/24/2022     CHIEF COMPLAINT Patient presents for  Chief Complaint  Patient presents with   1 year      HISTORY OF PRESENT ILLNESS: Michael Tran is a 76 y.o. male who presents to the clinic today for:   HPI   1 year fu ou oct Pt states his vision has been stable Pt denies any floaters or FOL Pt states "my vision has been better since I got my new glasses"  Last edited by Aleene Davidson, CMA on 01/24/2022  9:24 AM.      Referring physician: Irena Reichmann, DO [address] [place],  [place] [address]  HISTORICAL INFORMATION:   Selected notes from the MEDICAL RECORD NUMBER    Lab Results  Component Value Date   HGBA1C (H) 04/23/2007    6.6 (NOTE)   The ADA recommends the following therapeutic goals for glycemic   control related to Hgb A1C measurement:   Goal of Therapy:   < 7.0% Hgb A1C   Action Suggested:  > 8.0% Hgb A1C   Ref:  Diabetes Care, 22, Suppl. 1, 1999     CURRENT MEDICATIONS: No current outpatient medications on file. (Ophthalmic Drugs)   No current facility-administered medications for this visit. (Ophthalmic Drugs)   Current Outpatient Medications (Other)  Medication Sig   acetaminophen (TYLENOL) 325 MG tablet Take 325 mg by mouth every 6 (six) hours as needed (for headaches).   aspirin EC 81 MG tablet Take 81 mg by mouth at bedtime.   atenolol (TENORMIN) 25 MG tablet Take 25 mg by mouth at bedtime.    Cholecalciferol (VITAMIN D-3) 1000 units CAPS Take 1,000 Units by mouth 2 (two) times daily.   ciprofloxacin (CIPRO) 500 MG tablet Take 1 tablet (500 mg total) by mouth 2 (two) times daily. One po bid x 7 days   glipiZIDE (GLUCOTROL) 5 MG tablet Take 5 mg by mouth 2 (two) times daily before a meal.   lisinopril (PRINIVIL,ZESTRIL) 20 MG tablet Take 20 mg by mouth daily.   Magnesium 250 MG TABS Take 250 mg by mouth daily with breakfast.   metFORMIN (GLUCOPHAGE) 1000 MG tablet Take 1,000 mg by mouth 2 (two) times daily with a meal.    pioglitazone (ACTOS) 15 MG tablet Take 15 mg by mouth daily.   simvastatin (ZOCOR) 40 MG tablet Take 40 mg by mouth at bedtime.   tamsulosin (FLOMAX) 0.4 MG CAPS capsule Take 0.4 mg by mouth at bedtime.   No current facility-administered medications for this visit. (Other)      REVIEW OF SYSTEMS: ROS   Negative for: Constitutional, Gastrointestinal, Neurological, Skin, Genitourinary, Musculoskeletal, HENT, Endocrine, Cardiovascular, Eyes, Respiratory, Psychiatric, Allergic/Imm, Heme/Lymph Last edited by Aleene Davidson, CMA on 01/24/2022  9:24 AM.       ALLERGIES Allergies  Allergen Reactions   Erythromycin Other (See Comments)    Patient cannot recall reaction, but "cannot take it"    PAST MEDICAL HISTORY Past Medical History:  Diagnosis Date   Diabetes mellitus without complication (HCC)    Hypertension    Kidney stones    No past surgical history on file.  FAMILY HISTORY No family history on file.  SOCIAL HISTORY Social History   Tobacco Use   Smoking status: Never   Smokeless tobacco: Never  Substance Use Topics   Drug use: No         OPHTHALMIC EXAM:  Base Eye Exam     Visual Acuity (ETDRS)  Right Left   Dist [place] 20/30 20/30         Tonometry (Tonopen, 9:29 AM)       Right Left   Pressure 8 10         Visual Fields       Left Right    Full Full         Neuro/Psych     Oriented x3: Yes   Mood/Affect: Normal         Dilation     Both eyes: 1.0% Mydriacyl, 2.5% Phenylephrine @ 9:25 AM           Slit Lamp and Fundus Exam     External Exam       Right Left   External Normal Normal         Slit Lamp Exam       Right Left   Lids/Lashes Normal Normal   Conjunctiva/Sclera White and quiet White and quiet   Cornea Clear Clear   Anterior Chamber Deep and quiet Deep and quiet   Iris Round and reactive Round and reactive   Lens 2+ Nuclear sclerosis 2+ Nuclear sclerosis   Anterior Vitreous Normal Normal          Fundus Exam       Right Left   Posterior Vitreous Posterior vitreous detachment Posterior vitreous detachment   Disc Normal Normal   C/D Ratio 0.0 0.0   Macula Normal,, no drusen ,, no drusen, Microaneurysm on temporal edge of FAZ   Vessels no DR NPDR- Mild manifested by solitary microaneurysm temporal FAZ   Periphery Normal Normal            IMAGING AND PROCEDURES  Imaging and Procedures for 01/24/22  OCT, Retina - OU - Both Eyes       Right Eye Quality was good. Scan locations included subfoveal. Central Foveal Thickness: 253. Progression has been stable. Findings include vitreomacular adhesion .   Left Eye Quality was good. Scan locations included subfoveal. Central Foveal Thickness: 246. Progression has been stable. Findings include vitreous traction, vitreomacular adhesion .   Notes Bilateral PVD.  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 Emphasis was placed on tight glucose, blood pressure, and serum lipid control. Avoidance of smoking was emphasized. Maintenance of normal body weight was emphasized. Appropriate follow up dilated exam is 1 year.  With cataracts we will follow-up in 9 months No change OS over time  Posterior vitreous detachment of both eyes  The nature of posterior vitreous detachment was discussed with the patient as well as its physiology, its age prevalence, and its possible implication regarding retinal breaks and detachment.  An informational brochure was offered to the patient.  All the patient's questions were answered.  The patient was asked  to return if new or different flashes or floaters develops.   Patient was instructed to contact office immediately if any new changes were noticed. I explained to the patient that vitreous inside the eye is similar to jello inside a bowl. As the jello melts it can start to pull away from the bowl, similarly the  vitreous throughout our lives can begin to pull away from the retina. That process is called a posterior vitreous detachment. In some cases, the vitreous can tug hard enough on the retina to form a retinal tear. I discussed with the patient the signs and symptoms of a retinal detachment.  Do not rub the eye.       ICD-10-CM   1. Diabetes mellitus without complication (HCC)  E11.9 OCT, Retina - OU - Both Eyes    2. Nuclear sclerotic cataract of both eyes  H25.13     3. Vitreomacular adhesion of both eyes  H43.823     4. Mild nonproliferative diabetic retinopathy of left eye without macular edema associated with type 2 diabetes mellitus (HCC)  K02.5427     5. Posterior vitreous detachment of both eyes  H43.813       1.  Patient understands continued importance of good blood sugar control so as to prevent progression of diabetic eye disease and other systemic complications  2.  Moderate nuclear [place] cataracts are progressing.  Follow-up in 9 months OU  3.  Ophthalmic Meds Ordered this visit:  No orders of the defined types were placed in this encounter.      Return in about 9 months (around 10/26/2022) for DILATE OU, COLOR FP, OCT.  There are no Patient Instructions on file for this visit.   Explained the diagnoses, plan, and follow up with the patient and they expressed understanding.  Patient expressed understanding of the importance of proper follow up care.   Alford Highland Syanna Remmert M.D. Diseases & Surgery of the Retina and Vitreous [facility] 01/24/22     Abbreviations: M myopia (nearsighted); A astigmatism; H hyperopia (farsighted); P presbyopia;  Mrx spectacle prescription;  CTL contact lenses; OD right eye; OS left eye; OU both eyes  XT exotropia; ET esotropia; PEK punctate epithelial keratitis; PEE punctate epithelial erosions; DES dry eye syndrome; MGD meibomian gland dysfunction; ATs artificial tears; PFAT's preservative free artificial tears; NSC nuclear sclerotic cataract; PSC posterior subcapsular cataract; ERM epi-retinal membrane; PVD posterior vitreous detachment; RD retinal detachment; DM diabetes mellitus; DR diabetic retinopathy; NPDR non-proliferative diabetic retinopathy; PDR proliferative diabetic retinopathy; CSME clinically significant macular edema; DME diabetic macular edema; dbh dot blot hemorrhages; CWS cotton wool spot; POAG primary open angle glaucoma; C/D cup-to-disc ratio; HVF humphrey visual field; GVF goldmann visual field; OCT optical coherence tomography; IOP intraocular pressure; BRVO Branch retinal vein occlusion; CRVO central retinal vein occlusion; CRAO central retinal artery occlusion; BRAO branch retinal artery occlusion; RT retinal tear; SB scleral buckle; PPV pars plana vitrectomy; VH Vitreous hemorrhage; PRP panretinal laser photocoagulation; IVK intravitreal kenalog; VMT vitreomacular traction; MH Macular hole;  NVD neovascularization of the disc; NVE neovascularization elsewhere; AREDS age related eye disease study; ARMD age related macular degeneration; POAG primary open angle glaucoma; EBMD epithelial/anterior basement membrane dystrophy; ACIOL anterior chamber intraocular lens; IOL intraocular lens; PCIOL posterior chamber intraocular lens; Phaco/IOL phacoemulsification with intraocular lens placement; PRK photorefractive keratectomy; LASIK laser assisted in situ keratomileusis; HTN hypertension; DM diabetes mellitus; COPD chronic obstructive pulmonary disease

## 2022-02-26 DIAGNOSIS — N401 Enlarged prostate with lower urinary tract symptoms: Secondary | ICD-10-CM | POA: Diagnosis not present

## 2022-03-05 DIAGNOSIS — R3914 Feeling of incomplete bladder emptying: Secondary | ICD-10-CM | POA: Diagnosis not present

## 2022-03-05 DIAGNOSIS — R351 Nocturia: Secondary | ICD-10-CM | POA: Diagnosis not present

## 2022-03-05 DIAGNOSIS — R972 Elevated prostate specific antigen [PSA]: Secondary | ICD-10-CM | POA: Diagnosis not present

## 2022-03-05 DIAGNOSIS — N401 Enlarged prostate with lower urinary tract symptoms: Secondary | ICD-10-CM | POA: Diagnosis not present

## 2022-03-27 DIAGNOSIS — R011 Cardiac murmur, unspecified: Secondary | ICD-10-CM | POA: Diagnosis not present

## 2022-03-27 DIAGNOSIS — R946 Abnormal results of thyroid function studies: Secondary | ICD-10-CM | POA: Diagnosis not present

## 2022-03-27 DIAGNOSIS — I129 Hypertensive chronic kidney disease with stage 1 through stage 4 chronic kidney disease, or unspecified chronic kidney disease: Secondary | ICD-10-CM | POA: Diagnosis not present

## 2022-03-27 DIAGNOSIS — N400 Enlarged prostate without lower urinary tract symptoms: Secondary | ICD-10-CM | POA: Diagnosis not present

## 2022-03-27 DIAGNOSIS — M8580 Other specified disorders of bone density and structure, unspecified site: Secondary | ICD-10-CM | POA: Diagnosis not present

## 2022-03-27 DIAGNOSIS — E785 Hyperlipidemia, unspecified: Secondary | ICD-10-CM | POA: Diagnosis not present

## 2022-03-27 DIAGNOSIS — E118 Type 2 diabetes mellitus with unspecified complications: Secondary | ICD-10-CM | POA: Diagnosis not present

## 2022-03-27 DIAGNOSIS — E559 Vitamin D deficiency, unspecified: Secondary | ICD-10-CM | POA: Diagnosis not present

## 2022-03-27 DIAGNOSIS — Z125 Encounter for screening for malignant neoplasm of prostate: Secondary | ICD-10-CM | POA: Diagnosis not present

## 2022-04-03 DIAGNOSIS — E785 Hyperlipidemia, unspecified: Secondary | ICD-10-CM | POA: Diagnosis not present

## 2022-04-03 DIAGNOSIS — E559 Vitamin D deficiency, unspecified: Secondary | ICD-10-CM | POA: Diagnosis not present

## 2022-04-03 DIAGNOSIS — M8580 Other specified disorders of bone density and structure, unspecified site: Secondary | ICD-10-CM | POA: Diagnosis not present

## 2022-04-03 DIAGNOSIS — H6120 Impacted cerumen, unspecified ear: Secondary | ICD-10-CM | POA: Diagnosis not present

## 2022-04-03 DIAGNOSIS — E118 Type 2 diabetes mellitus with unspecified complications: Secondary | ICD-10-CM | POA: Diagnosis not present

## 2022-04-03 DIAGNOSIS — N400 Enlarged prostate without lower urinary tract symptoms: Secondary | ICD-10-CM | POA: Diagnosis not present

## 2022-04-03 DIAGNOSIS — Z0000 Encounter for general adult medical examination without abnormal findings: Secondary | ICD-10-CM | POA: Diagnosis not present

## 2022-04-03 DIAGNOSIS — I129 Hypertensive chronic kidney disease with stage 1 through stage 4 chronic kidney disease, or unspecified chronic kidney disease: Secondary | ICD-10-CM | POA: Diagnosis not present

## 2022-07-25 DIAGNOSIS — E559 Vitamin D deficiency, unspecified: Secondary | ICD-10-CM | POA: Diagnosis not present

## 2022-07-25 DIAGNOSIS — I129 Hypertensive chronic kidney disease with stage 1 through stage 4 chronic kidney disease, or unspecified chronic kidney disease: Secondary | ICD-10-CM | POA: Diagnosis not present

## 2022-07-25 DIAGNOSIS — E785 Hyperlipidemia, unspecified: Secondary | ICD-10-CM | POA: Diagnosis not present

## 2022-07-25 DIAGNOSIS — M8580 Other specified disorders of bone density and structure, unspecified site: Secondary | ICD-10-CM | POA: Diagnosis not present

## 2022-07-25 DIAGNOSIS — E118 Type 2 diabetes mellitus with unspecified complications: Secondary | ICD-10-CM | POA: Diagnosis not present

## 2022-08-01 DIAGNOSIS — E118 Type 2 diabetes mellitus with unspecified complications: Secondary | ICD-10-CM | POA: Diagnosis not present

## 2022-08-01 DIAGNOSIS — E785 Hyperlipidemia, unspecified: Secondary | ICD-10-CM | POA: Diagnosis not present

## 2022-08-01 DIAGNOSIS — M8580 Other specified disorders of bone density and structure, unspecified site: Secondary | ICD-10-CM | POA: Diagnosis not present

## 2022-08-01 DIAGNOSIS — N400 Enlarged prostate without lower urinary tract symptoms: Secondary | ICD-10-CM | POA: Diagnosis not present

## 2022-08-01 DIAGNOSIS — I129 Hypertensive chronic kidney disease with stage 1 through stage 4 chronic kidney disease, or unspecified chronic kidney disease: Secondary | ICD-10-CM | POA: Diagnosis not present

## 2022-08-01 DIAGNOSIS — M109 Gout, unspecified: Secondary | ICD-10-CM | POA: Diagnosis not present

## 2022-10-29 ENCOUNTER — Encounter (INDEPENDENT_AMBULATORY_CARE_PROVIDER_SITE_OTHER): Payer: Medicare HMO | Admitting: Ophthalmology

## 2022-10-29 DIAGNOSIS — H43813 Vitreous degeneration, bilateral: Secondary | ICD-10-CM | POA: Diagnosis not present

## 2022-10-29 DIAGNOSIS — H2513 Age-related nuclear cataract, bilateral: Secondary | ICD-10-CM | POA: Diagnosis not present

## 2022-10-29 DIAGNOSIS — E113292 Type 2 diabetes mellitus with mild nonproliferative diabetic retinopathy without macular edema, left eye: Secondary | ICD-10-CM | POA: Diagnosis not present

## 2022-11-26 DIAGNOSIS — E785 Hyperlipidemia, unspecified: Secondary | ICD-10-CM | POA: Diagnosis not present

## 2022-11-26 DIAGNOSIS — E118 Type 2 diabetes mellitus with unspecified complications: Secondary | ICD-10-CM | POA: Diagnosis not present

## 2022-11-26 DIAGNOSIS — M8580 Other specified disorders of bone density and structure, unspecified site: Secondary | ICD-10-CM | POA: Diagnosis not present

## 2022-11-26 DIAGNOSIS — N400 Enlarged prostate without lower urinary tract symptoms: Secondary | ICD-10-CM | POA: Diagnosis not present

## 2022-11-26 DIAGNOSIS — M109 Gout, unspecified: Secondary | ICD-10-CM | POA: Diagnosis not present

## 2022-11-26 DIAGNOSIS — I129 Hypertensive chronic kidney disease with stage 1 through stage 4 chronic kidney disease, or unspecified chronic kidney disease: Secondary | ICD-10-CM | POA: Diagnosis not present

## 2022-12-03 DIAGNOSIS — R7989 Other specified abnormal findings of blood chemistry: Secondary | ICD-10-CM | POA: Diagnosis not present

## 2022-12-03 DIAGNOSIS — M8580 Other specified disorders of bone density and structure, unspecified site: Secondary | ICD-10-CM | POA: Diagnosis not present

## 2022-12-03 DIAGNOSIS — N400 Enlarged prostate without lower urinary tract symptoms: Secondary | ICD-10-CM | POA: Diagnosis not present

## 2022-12-03 DIAGNOSIS — E559 Vitamin D deficiency, unspecified: Secondary | ICD-10-CM | POA: Diagnosis not present

## 2022-12-03 DIAGNOSIS — E118 Type 2 diabetes mellitus with unspecified complications: Secondary | ICD-10-CM | POA: Diagnosis not present

## 2022-12-03 DIAGNOSIS — E785 Hyperlipidemia, unspecified: Secondary | ICD-10-CM | POA: Diagnosis not present

## 2022-12-03 DIAGNOSIS — I10 Essential (primary) hypertension: Secondary | ICD-10-CM | POA: Diagnosis not present

## 2022-12-03 DIAGNOSIS — M109 Gout, unspecified: Secondary | ICD-10-CM | POA: Diagnosis not present

## 2023-03-25 DIAGNOSIS — N401 Enlarged prostate with lower urinary tract symptoms: Secondary | ICD-10-CM | POA: Diagnosis not present

## 2023-04-01 DIAGNOSIS — R7989 Other specified abnormal findings of blood chemistry: Secondary | ICD-10-CM | POA: Diagnosis not present

## 2023-04-01 DIAGNOSIS — N401 Enlarged prostate with lower urinary tract symptoms: Secondary | ICD-10-CM | POA: Diagnosis not present

## 2023-04-01 DIAGNOSIS — E118 Type 2 diabetes mellitus with unspecified complications: Secondary | ICD-10-CM | POA: Diagnosis not present

## 2023-04-01 DIAGNOSIS — N400 Enlarged prostate without lower urinary tract symptoms: Secondary | ICD-10-CM | POA: Diagnosis not present

## 2023-04-01 DIAGNOSIS — I10 Essential (primary) hypertension: Secondary | ICD-10-CM | POA: Diagnosis not present

## 2023-04-01 DIAGNOSIS — R351 Nocturia: Secondary | ICD-10-CM | POA: Diagnosis not present

## 2023-04-01 DIAGNOSIS — R972 Elevated prostate specific antigen [PSA]: Secondary | ICD-10-CM | POA: Diagnosis not present

## 2023-04-01 DIAGNOSIS — E785 Hyperlipidemia, unspecified: Secondary | ICD-10-CM | POA: Diagnosis not present

## 2023-04-01 DIAGNOSIS — R3914 Feeling of incomplete bladder emptying: Secondary | ICD-10-CM | POA: Diagnosis not present

## 2023-04-01 DIAGNOSIS — E559 Vitamin D deficiency, unspecified: Secondary | ICD-10-CM | POA: Diagnosis not present

## 2023-04-08 DIAGNOSIS — I10 Essential (primary) hypertension: Secondary | ICD-10-CM | POA: Diagnosis not present

## 2023-04-08 DIAGNOSIS — E559 Vitamin D deficiency, unspecified: Secondary | ICD-10-CM | POA: Diagnosis not present

## 2023-04-08 DIAGNOSIS — Z0000 Encounter for general adult medical examination without abnormal findings: Secondary | ICD-10-CM | POA: Diagnosis not present

## 2023-04-08 DIAGNOSIS — M8580 Other specified disorders of bone density and structure, unspecified site: Secondary | ICD-10-CM | POA: Diagnosis not present

## 2023-04-08 DIAGNOSIS — N400 Enlarged prostate without lower urinary tract symptoms: Secondary | ICD-10-CM | POA: Diagnosis not present

## 2023-04-08 DIAGNOSIS — E118 Type 2 diabetes mellitus with unspecified complications: Secondary | ICD-10-CM | POA: Diagnosis not present

## 2023-04-08 DIAGNOSIS — R4189 Other symptoms and signs involving cognitive functions and awareness: Secondary | ICD-10-CM | POA: Diagnosis not present

## 2023-04-08 DIAGNOSIS — E785 Hyperlipidemia, unspecified: Secondary | ICD-10-CM | POA: Diagnosis not present

## 2023-07-05 DIAGNOSIS — E559 Vitamin D deficiency, unspecified: Secondary | ICD-10-CM | POA: Diagnosis not present

## 2023-07-05 DIAGNOSIS — I10 Essential (primary) hypertension: Secondary | ICD-10-CM | POA: Diagnosis not present

## 2023-07-05 DIAGNOSIS — E118 Type 2 diabetes mellitus with unspecified complications: Secondary | ICD-10-CM | POA: Diagnosis not present

## 2023-07-16 DIAGNOSIS — E118 Type 2 diabetes mellitus with unspecified complications: Secondary | ICD-10-CM | POA: Diagnosis not present

## 2023-07-16 DIAGNOSIS — N400 Enlarged prostate without lower urinary tract symptoms: Secondary | ICD-10-CM | POA: Diagnosis not present

## 2023-07-16 DIAGNOSIS — E785 Hyperlipidemia, unspecified: Secondary | ICD-10-CM | POA: Diagnosis not present

## 2023-07-16 DIAGNOSIS — I10 Essential (primary) hypertension: Secondary | ICD-10-CM | POA: Diagnosis not present

## 2023-07-16 DIAGNOSIS — R4189 Other symptoms and signs involving cognitive functions and awareness: Secondary | ICD-10-CM | POA: Diagnosis not present

## 2023-07-16 DIAGNOSIS — Z79899 Other long term (current) drug therapy: Secondary | ICD-10-CM | POA: Diagnosis not present

## 2023-10-30 DIAGNOSIS — I10 Essential (primary) hypertension: Secondary | ICD-10-CM | POA: Diagnosis not present

## 2023-10-30 DIAGNOSIS — Z79899 Other long term (current) drug therapy: Secondary | ICD-10-CM | POA: Diagnosis not present

## 2023-10-30 DIAGNOSIS — E785 Hyperlipidemia, unspecified: Secondary | ICD-10-CM | POA: Diagnosis not present

## 2023-10-30 DIAGNOSIS — E118 Type 2 diabetes mellitus with unspecified complications: Secondary | ICD-10-CM | POA: Diagnosis not present

## 2023-11-06 DIAGNOSIS — E118 Type 2 diabetes mellitus with unspecified complications: Secondary | ICD-10-CM | POA: Diagnosis not present

## 2023-11-06 DIAGNOSIS — R4189 Other symptoms and signs involving cognitive functions and awareness: Secondary | ICD-10-CM | POA: Diagnosis not present

## 2023-11-06 DIAGNOSIS — E785 Hyperlipidemia, unspecified: Secondary | ICD-10-CM | POA: Diagnosis not present

## 2023-11-06 DIAGNOSIS — N400 Enlarged prostate without lower urinary tract symptoms: Secondary | ICD-10-CM | POA: Diagnosis not present

## 2023-11-06 DIAGNOSIS — I10 Essential (primary) hypertension: Secondary | ICD-10-CM | POA: Diagnosis not present

## 2023-11-12 DIAGNOSIS — E113292 Type 2 diabetes mellitus with mild nonproliferative diabetic retinopathy without macular edema, left eye: Secondary | ICD-10-CM | POA: Diagnosis not present

## 2023-11-12 DIAGNOSIS — H2513 Age-related nuclear cataract, bilateral: Secondary | ICD-10-CM | POA: Diagnosis not present

## 2023-11-12 DIAGNOSIS — H43813 Vitreous degeneration, bilateral: Secondary | ICD-10-CM | POA: Diagnosis not present

## 2024-03-03 DIAGNOSIS — E118 Type 2 diabetes mellitus with unspecified complications: Secondary | ICD-10-CM | POA: Diagnosis not present

## 2024-03-03 DIAGNOSIS — I10 Essential (primary) hypertension: Secondary | ICD-10-CM | POA: Diagnosis not present

## 2024-03-03 DIAGNOSIS — E785 Hyperlipidemia, unspecified: Secondary | ICD-10-CM | POA: Diagnosis not present

## 2024-03-10 DIAGNOSIS — F0670 Mild neurocognitive disorder due to known physiological condition without behavioral disturbance: Secondary | ICD-10-CM | POA: Diagnosis not present

## 2024-03-10 DIAGNOSIS — N400 Enlarged prostate without lower urinary tract symptoms: Secondary | ICD-10-CM | POA: Diagnosis not present

## 2024-03-10 DIAGNOSIS — H9193 Unspecified hearing loss, bilateral: Secondary | ICD-10-CM | POA: Diagnosis not present

## 2024-03-10 DIAGNOSIS — E781 Pure hyperglyceridemia: Secondary | ICD-10-CM | POA: Diagnosis not present

## 2024-03-10 DIAGNOSIS — E118 Type 2 diabetes mellitus with unspecified complications: Secondary | ICD-10-CM | POA: Diagnosis not present

## 2024-03-10 DIAGNOSIS — E785 Hyperlipidemia, unspecified: Secondary | ICD-10-CM | POA: Diagnosis not present

## 2024-03-10 DIAGNOSIS — I10 Essential (primary) hypertension: Secondary | ICD-10-CM | POA: Diagnosis not present

## 2024-03-31 DIAGNOSIS — R972 Elevated prostate specific antigen [PSA]: Secondary | ICD-10-CM | POA: Diagnosis not present

## 2024-04-07 DIAGNOSIS — N476 Balanoposthitis: Secondary | ICD-10-CM | POA: Diagnosis not present

## 2024-04-07 DIAGNOSIS — R3912 Poor urinary stream: Secondary | ICD-10-CM | POA: Diagnosis not present

## 2024-04-07 DIAGNOSIS — R351 Nocturia: Secondary | ICD-10-CM | POA: Diagnosis not present

## 2024-04-07 DIAGNOSIS — N401 Enlarged prostate with lower urinary tract symptoms: Secondary | ICD-10-CM | POA: Diagnosis not present

## 2024-04-07 DIAGNOSIS — R3914 Feeling of incomplete bladder emptying: Secondary | ICD-10-CM | POA: Diagnosis not present

## 2024-04-07 DIAGNOSIS — R972 Elevated prostate specific antigen [PSA]: Secondary | ICD-10-CM | POA: Diagnosis not present

## 2024-06-10 DIAGNOSIS — I10 Essential (primary) hypertension: Secondary | ICD-10-CM | POA: Diagnosis not present

## 2024-06-10 DIAGNOSIS — E785 Hyperlipidemia, unspecified: Secondary | ICD-10-CM | POA: Diagnosis not present

## 2024-06-10 DIAGNOSIS — E781 Pure hyperglyceridemia: Secondary | ICD-10-CM | POA: Diagnosis not present
